# Patient Record
Sex: Male | Born: 1935 | Race: White | Hispanic: No | Marital: Married | Smoking: Current every day smoker
Health system: Southern US, Community
[De-identification: ages and names within clinical notes are randomized; demographics above are authoritative.]

## PROBLEM LIST (undated history)

## (undated) DIAGNOSIS — N210 Calculus in bladder: Secondary | ICD-10-CM

## (undated) DIAGNOSIS — Z862 Personal history of diseases of the blood and blood-forming organs and certain disorders involving the immune mechanism: Secondary | ICD-10-CM

## (undated) DIAGNOSIS — N201 Calculus of ureter: Secondary | ICD-10-CM

## (undated) DIAGNOSIS — K8020 Calculus of gallbladder without cholecystitis without obstruction: Secondary | ICD-10-CM

## (undated) DIAGNOSIS — Z86711 Personal history of pulmonary embolism: Secondary | ICD-10-CM

## (undated) DIAGNOSIS — M1990 Unspecified osteoarthritis, unspecified site: Secondary | ICD-10-CM

## (undated) DIAGNOSIS — Z87442 Personal history of urinary calculi: Secondary | ICD-10-CM

## (undated) DIAGNOSIS — N281 Cyst of kidney, acquired: Secondary | ICD-10-CM

## (undated) DIAGNOSIS — Z973 Presence of spectacles and contact lenses: Secondary | ICD-10-CM

## (undated) DIAGNOSIS — R972 Elevated prostate specific antigen [PSA]: Secondary | ICD-10-CM

## (undated) DIAGNOSIS — N400 Enlarged prostate without lower urinary tract symptoms: Secondary | ICD-10-CM

## (undated) DIAGNOSIS — Z974 Presence of external hearing-aid: Secondary | ICD-10-CM

## (undated) DIAGNOSIS — D649 Anemia, unspecified: Secondary | ICD-10-CM

## (undated) DIAGNOSIS — C61 Malignant neoplasm of prostate: Secondary | ICD-10-CM

## (undated) DIAGNOSIS — N200 Calculus of kidney: Secondary | ICD-10-CM

## (undated) DIAGNOSIS — Z85828 Personal history of other malignant neoplasm of skin: Secondary | ICD-10-CM

## (undated) DIAGNOSIS — R399 Unspecified symptoms and signs involving the genitourinary system: Secondary | ICD-10-CM

## (undated) DIAGNOSIS — Z8781 Personal history of (healed) traumatic fracture: Secondary | ICD-10-CM

## (undated) HISTORY — DX: Malignant neoplasm of prostate: C61

## (undated) HISTORY — PX: APPENDECTOMY: SHX54

## (undated) HISTORY — PX: CYSTOSCOPY/RETROGRADE/URETEROSCOPY/STONE EXTRACTION WITH BASKET: SHX5317

## (undated) HISTORY — PX: EXTRACORPOREAL SHOCK WAVE LITHOTRIPSY: SHX1557

## (undated) HISTORY — DX: Personal history of pulmonary embolism: Z86.711

---

## 1963-04-18 HISTORY — PX: PERCUTANEOUS NEPHROSTOLITHOTOMY: SHX2207

## 1999-03-15 ENCOUNTER — Inpatient Hospital Stay (HOSPITAL_COMMUNITY): Admission: EM | Admit: 1999-03-15 | Discharge: 1999-03-25 | Payer: Self-pay | Admitting: Family Medicine

## 1999-03-15 ENCOUNTER — Encounter: Payer: Self-pay | Admitting: Family Medicine

## 2000-10-12 ENCOUNTER — Encounter: Admission: RE | Admit: 2000-10-12 | Discharge: 2000-10-12 | Payer: Self-pay | Admitting: Urology

## 2000-10-12 ENCOUNTER — Encounter: Payer: Self-pay | Admitting: Urology

## 2000-10-24 ENCOUNTER — Encounter: Payer: Self-pay | Admitting: Urology

## 2000-10-25 ENCOUNTER — Ambulatory Visit (HOSPITAL_COMMUNITY): Admission: RE | Admit: 2000-10-25 | Discharge: 2000-10-25 | Payer: Self-pay | Admitting: Urology

## 2000-10-25 ENCOUNTER — Encounter: Payer: Self-pay | Admitting: Urology

## 2000-12-07 ENCOUNTER — Encounter: Admission: RE | Admit: 2000-12-07 | Discharge: 2000-12-07 | Payer: Self-pay | Admitting: Urology

## 2000-12-07 ENCOUNTER — Encounter: Payer: Self-pay | Admitting: Urology

## 2001-04-22 ENCOUNTER — Encounter: Admission: RE | Admit: 2001-04-22 | Discharge: 2001-04-22 | Payer: Self-pay | Admitting: Urology

## 2001-04-22 ENCOUNTER — Encounter: Payer: Self-pay | Admitting: Urology

## 2004-02-26 ENCOUNTER — Ambulatory Visit: Payer: Self-pay | Admitting: Oncology

## 2004-04-22 ENCOUNTER — Ambulatory Visit: Payer: Self-pay | Admitting: Oncology

## 2005-02-24 ENCOUNTER — Ambulatory Visit: Payer: Self-pay | Admitting: Oncology

## 2006-03-14 ENCOUNTER — Ambulatory Visit: Payer: Self-pay | Admitting: Oncology

## 2006-03-19 LAB — CBC WITH DIFFERENTIAL/PLATELET
BASO%: 0.8 % (ref 0.0–2.0)
Basophils Absolute: 0 10*3/uL (ref 0.0–0.1)
EOS%: 4 % (ref 0.0–7.0)
HCT: 42.4 % (ref 38.7–49.9)
HGB: 14.4 g/dL (ref 13.0–17.1)
LYMPH%: 22.4 % (ref 14.0–48.0)
MCH: 30.1 pg (ref 28.0–33.4)
MCHC: 34 g/dL (ref 32.0–35.9)
MONO#: 0.5 10*3/uL (ref 0.1–0.9)
NEUT%: 65.2 % (ref 40.0–75.0)
Platelets: 226 10*3/uL (ref 145–400)

## 2007-03-12 ENCOUNTER — Ambulatory Visit: Payer: Self-pay | Admitting: Oncology

## 2007-03-18 LAB — CBC WITH DIFFERENTIAL/PLATELET
BASO%: 1.2 % (ref 0.0–2.0)
Basophils Absolute: 0.1 10*3/uL (ref 0.0–0.1)
EOS%: 4.9 % (ref 0.0–7.0)
HCT: 40.8 % (ref 38.7–49.9)
HGB: 14.5 g/dL (ref 13.0–17.1)
MCH: 31.1 pg (ref 28.0–33.4)
MCHC: 35.6 g/dL (ref 32.0–35.9)
MCV: 87.4 fL (ref 81.6–98.0)
MONO%: 7 % (ref 0.0–13.0)
NEUT%: 63 % (ref 40.0–75.0)
lymph#: 1.1 10*3/uL (ref 0.9–3.3)

## 2008-03-26 ENCOUNTER — Ambulatory Visit: Payer: Self-pay | Admitting: Oncology

## 2008-03-30 LAB — CBC WITH DIFFERENTIAL/PLATELET
Basophils Absolute: 0 10*3/uL (ref 0.0–0.1)
Eosinophils Absolute: 0.2 10*3/uL (ref 0.0–0.5)
HCT: 40.6 % (ref 38.7–49.9)
HGB: 14.2 g/dL (ref 13.0–17.1)
LYMPH%: 26.5 % (ref 14.0–48.0)
MONO#: 0.4 10*3/uL (ref 0.1–0.9)
NEUT#: 3.3 10*3/uL (ref 1.5–6.5)
NEUT%: 61.6 % (ref 40.0–75.0)
Platelets: 198 10*3/uL (ref 145–400)
WBC: 5.3 10*3/uL (ref 4.0–10.0)
lymph#: 1.4 10*3/uL (ref 0.9–3.3)

## 2009-03-26 ENCOUNTER — Ambulatory Visit: Payer: Self-pay | Admitting: Oncology

## 2009-03-30 LAB — COMPREHENSIVE METABOLIC PANEL
Alkaline Phosphatase: 60 U/L (ref 39–117)
BUN: 18 mg/dL (ref 6–23)
Creatinine, Ser: 0.9 mg/dL (ref 0.40–1.50)
Glucose, Bld: 96 mg/dL (ref 70–99)
Sodium: 142 mEq/L (ref 135–145)
Total Bilirubin: 0.4 mg/dL (ref 0.3–1.2)
Total Protein: 6.3 g/dL (ref 6.0–8.3)

## 2009-03-30 LAB — CBC WITH DIFFERENTIAL/PLATELET
Eosinophils Absolute: 0.2 10*3/uL (ref 0.0–0.5)
LYMPH%: 29.3 % (ref 14.0–49.0)
MCV: 83.9 fL (ref 79.3–98.0)
MONO%: 9.4 % (ref 0.0–14.0)
NEUT#: 2.4 10*3/uL (ref 1.5–6.5)
NEUT%: 55.9 % (ref 39.0–75.0)
Platelets: 221 10*3/uL (ref 140–400)
RBC: 4.24 10*6/uL (ref 4.20–5.82)

## 2009-05-31 ENCOUNTER — Ambulatory Visit: Payer: Self-pay | Admitting: Oncology

## 2009-06-02 LAB — CBC & DIFF AND RETIC
Basophils Absolute: 0 10*3/uL (ref 0.0–0.1)
Eosinophils Absolute: 0.2 10*3/uL (ref 0.0–0.5)
HCT: 39.6 % (ref 38.4–49.9)
Immature Retic Fract: 6 % (ref 0.00–13.40)
LYMPH%: 25.9 % (ref 14.0–49.0)
MCV: 86.1 fL (ref 79.3–98.0)
MONO#: 0.4 10*3/uL (ref 0.1–0.9)
MONO%: 8.1 % (ref 0.0–14.0)
NEUT#: 3 10*3/uL (ref 1.5–6.5)
NEUT%: 61.6 % (ref 39.0–75.0)
Platelets: 163 10*3/uL (ref 140–400)
WBC: 4.9 10*3/uL (ref 4.0–10.3)

## 2009-06-02 LAB — MORPHOLOGY
PLT EST: ADEQUATE
RBC Comments: NORMAL

## 2009-06-02 LAB — IRON AND TIBC: %SAT: 60 % — ABNORMAL HIGH (ref 20–55)

## 2009-06-02 LAB — VITAMIN B12: Vitamin B-12: 224 pg/mL (ref 211–911)

## 2009-06-02 LAB — CHCC SMEAR

## 2009-09-26 ENCOUNTER — Ambulatory Visit (HOSPITAL_COMMUNITY): Admission: EM | Admit: 2009-09-26 | Discharge: 2009-09-26 | Payer: Self-pay | Admitting: Emergency Medicine

## 2009-09-27 HISTORY — PX: OTHER SURGICAL HISTORY: SHX169

## 2009-09-30 ENCOUNTER — Ambulatory Visit (HOSPITAL_COMMUNITY): Admission: RE | Admit: 2009-09-30 | Discharge: 2009-09-30 | Payer: Self-pay | Admitting: Urology

## 2009-09-30 HISTORY — PX: EXTRACORPOREAL SHOCK WAVE LITHOTRIPSY: SHX1557

## 2009-12-09 ENCOUNTER — Ambulatory Visit: Payer: Self-pay | Admitting: Oncology

## 2009-12-13 LAB — CBC & DIFF AND RETIC
BASO%: 0.4 % (ref 0.0–2.0)
Basophils Absolute: 0 10*3/uL (ref 0.0–0.1)
EOS%: 3.3 % (ref 0.0–7.0)
HGB: 14.5 g/dL (ref 13.0–17.1)
MCH: 31.5 pg (ref 27.2–33.4)
MCHC: 34.3 g/dL (ref 32.0–36.0)
MCV: 92 fL (ref 79.3–98.0)
MONO%: 7.4 % (ref 0.0–14.0)
NEUT%: 62.5 % (ref 39.0–75.0)
RDW: 13.2 % (ref 11.0–14.6)
lymph#: 1.5 10*3/uL (ref 0.9–3.3)

## 2010-05-10 ENCOUNTER — Ambulatory Visit
Admission: RE | Admit: 2010-05-10 | Discharge: 2010-05-10 | Payer: Self-pay | Source: Home / Self Care | Attending: Urology | Admitting: Urology

## 2010-05-10 HISTORY — PX: OTHER SURGICAL HISTORY: SHX169

## 2010-05-11 LAB — POCT HEMOGLOBIN-HEMACUE: Hemoglobin: 15.1 g/dL (ref 13.0–17.0)

## 2010-05-20 NOTE — Op Note (Signed)
[name], [name]                 ACCOUNT NO.:  [id number]  MEDICAL RECORD NO.:  [id number]          PATIENT TYPE:  AMB  LOCATION:  [facility]                         FACILITY:  [facility]  PHYSICIAN:  Danae Chen, M.D.  DATE OF BIRTH:  Aug 10, 1929  DATE OF PROCEDURE:  05/10/2010 DATE OF DISCHARGE:                              OPERATIVE REPORT   PREOPERATIVE DIAGNOSIS:  Left ureteral calculi and bladder stone.  POSTOPERATIVE DIAGNOSIS:  Left ureteral calculi and bladder stone.  PROCEDURE:  Cystoscopy, left retrograde pyelogram, ureteroscopy, holmium laser for left ureteral calculi, and removal of bladder stone.  SURGEON:  Danae Chen, MD  ANESTHESIA:  General.  INDICATIONS:  The patient is a 77 year old male with a long history of kidney stone.  He has been having left flank and left lower quadrant pain on and off.  CT scan showed bilateral renal calculi, minimal left hydronephrosis, and two stones in the distal ureter, one measures 7 mm, the other one 5 mm.  He was also found to have a 5-mm stone in the bladder.  He has not passed any stone.  He is scheduled today for cystoscopy, retrograde pyelogram, ureteroscopy, holmium laser of the ureteral calculi, and removal of bladder stone.  The procedure, the risks and benefits were discussed in detail with the patient and his wife.  The risks include but are not limited to hemorrhage, infection, ureteral injury, inability to extract stones.  He understands and gave informed consent.  The patient was identified by his wrist band and proper time-out was taken.  DESCRIPTION OF PROCEDURE:  Under general anesthesia, he was prepped and draped and placed in the dorsal lithotomy position.  A panendoscope was inserted in the bladder.  The anterior urethra is normal.  He has trilobar prostatic hypertrophy.  The bladder is moderately trabeculated. There is a stone in the bladder.  There is no tumor in the bladder.  The ureteral orifices are in  normal position and shape.  Retrograde pyelogram: A sensor wire was passed through the cystoscope through the left ureteral orifice and a #6-French open-ended catheter was passed over the sensor wire into the distal ureter.  The sensor wire was then removed.  Contrast was then injected through the open-ended catheter.  There are two filling defects in the distal ureter above the ureterovesical junction.  The ureter proximal to this filling defect is mildly dilated.  The renal pelvis and collecting system appear normal. The sensor wire was then passed through the open-ended catheter and the open-ended catheter was removed.  The bladder was emptied and the cystoscope removed.  A semi rigid ureteroscope was then passed in the bladder and through the left ureteral orifice.  There are two stones in the distal ureter, the most distal stone is larger than the proximal stone.  With the holmium laser, the stone was fragmented in multiple smaller fragments and all stone fragments were removed with the nitinol basket.  The ureteroscope was advanced all the way up into the upper ureter and there was no evidence of remaining stone fragments in the ureter.  Second retrograde pyelogram: Contrast was then  injected through the ureteroscope and there is no evidence of extravasation of contrast and there are no filling defects in the ureter.  The ureteroscope was then removed.  The cystoscope was reinserted in the bladder and all stone fragments were irrigated out of the bladder.  The bladder stone was also caught within the wire of the nitinol basket and extracted.  The guidewire was then back loaded into the cystoscope and a #6-French - 26 double-J stent was passed over the guidewire.  The proximal curl of the double-J stent is in the renal pelvis.  The distal curl is in the bladder.  The bladder was then emptied and the cystoscope and guidewire were removed.  The patient tolerated the procedure well  and left the OR in satisfactory condition to post anesthesia care unit.  The double-J stent was left with a string for easy removal.     Danae Chen, M.D.     MN/MEDQ  D:  05/10/2010  T:  05/10/2010  Job:  [id number]  Electronically Signed by Lindaann Slough M.D. on 05/20/2010 01:13:08 PM

## 2010-06-06 ENCOUNTER — Other Ambulatory Visit: Payer: Self-pay | Admitting: Oncology

## 2010-06-06 ENCOUNTER — Encounter (HOSPITAL_BASED_OUTPATIENT_CLINIC_OR_DEPARTMENT_OTHER): Payer: BC Managed Care – PPO | Admitting: Oncology

## 2010-06-06 DIAGNOSIS — C61 Malignant neoplasm of prostate: Secondary | ICD-10-CM

## 2010-06-06 DIAGNOSIS — Z7901 Long term (current) use of anticoagulants: Secondary | ICD-10-CM

## 2010-06-06 DIAGNOSIS — Z86718 Personal history of other venous thrombosis and embolism: Secondary | ICD-10-CM

## 2010-06-06 DIAGNOSIS — D509 Iron deficiency anemia, unspecified: Secondary | ICD-10-CM

## 2010-06-06 LAB — BASIC METABOLIC PANEL
BUN: 17 mg/dL (ref 6–23)
CO2: 29 mEq/L (ref 19–32)
Calcium: 8.9 mg/dL (ref 8.4–10.5)
Glucose, Bld: 84 mg/dL (ref 70–99)
Sodium: 143 mEq/L (ref 135–145)

## 2010-06-06 LAB — CBC WITH DIFFERENTIAL/PLATELET
Basophils Absolute: 0 10*3/uL (ref 0.0–0.1)
Eosinophils Absolute: 0.2 10*3/uL (ref 0.0–0.5)
HCT: 41.3 % (ref 38.4–49.9)
HGB: 14.2 g/dL (ref 13.0–17.1)
LYMPH%: 24.3 % (ref 14.0–49.0)
MCV: 92.8 fL (ref 79.3–98.0)
MONO#: 0.4 10*3/uL (ref 0.1–0.9)
MONO%: 7 % (ref 0.0–14.0)
NEUT#: 3.2 10*3/uL (ref 1.5–6.5)
NEUT%: 64.7 % (ref 39.0–75.0)
Platelets: 173 10*3/uL (ref 140–400)
WBC: 5 10*3/uL (ref 4.0–10.3)

## 2010-06-06 LAB — PSA: PSA: 6.47 ng/mL — ABNORMAL HIGH (ref <=4.00)

## 2010-06-13 ENCOUNTER — Encounter (HOSPITAL_BASED_OUTPATIENT_CLINIC_OR_DEPARTMENT_OTHER): Payer: BC Managed Care – PPO | Admitting: Oncology

## 2010-06-13 DIAGNOSIS — C61 Malignant neoplasm of prostate: Secondary | ICD-10-CM

## 2010-06-13 DIAGNOSIS — Z86718 Personal history of other venous thrombosis and embolism: Secondary | ICD-10-CM

## 2010-06-13 DIAGNOSIS — Z7901 Long term (current) use of anticoagulants: Secondary | ICD-10-CM

## 2010-06-13 DIAGNOSIS — D509 Iron deficiency anemia, unspecified: Secondary | ICD-10-CM

## 2010-07-04 LAB — PSA: PSA: 5.02 ng/mL — ABNORMAL HIGH (ref 0.10–4.00)

## 2010-07-04 LAB — DIFFERENTIAL
Basophils Absolute: 0 K/uL (ref 0.0–0.1)
Basophils Relative: 1 % (ref 0–1)
Eosinophils Absolute: 0.2 10*3/uL (ref 0.0–0.7)
Eosinophils Relative: 3 % (ref 0–5)
Lymphocytes Relative: 16 % (ref 12–46)
Lymphs Abs: 1.1 K/uL (ref 0.7–4.0)
Monocytes Absolute: 0.4 10*3/uL (ref 0.1–1.0)
Monocytes Relative: 6 % (ref 3–12)
Neutro Abs: 5.5 10*3/uL (ref 1.7–7.7)
Neutrophils Relative %: 75 % (ref 43–77)

## 2010-07-04 LAB — URINALYSIS, ROUTINE W REFLEX MICROSCOPIC
Bilirubin Urine: NEGATIVE
Glucose, UA: NEGATIVE mg/dL
Ketones, ur: NEGATIVE mg/dL
Nitrite: NEGATIVE
Protein, ur: 30 mg/dL — AB
Specific Gravity, Urine: 1.023 (ref 1.005–1.030)
Urobilinogen, UA: 1 mg/dL (ref 0.0–1.0)
pH: 6 (ref 5.0–8.0)

## 2010-07-04 LAB — CBC
HCT: 42.2 % (ref 39.0–52.0)
Hemoglobin: 14.1 g/dL (ref 13.0–17.0)
MCHC: 33.4 g/dL (ref 30.0–36.0)
MCV: 94 fL (ref 78.0–100.0)
Platelets: 174 K/uL (ref 150–400)
RBC: 4.49 MIL/uL (ref 4.22–5.81)
RDW: 13.3 % (ref 11.5–15.5)
WBC: 7.3 10*3/uL (ref 4.0–10.5)

## 2010-07-04 LAB — URINE MICROSCOPIC-ADD ON

## 2010-07-04 LAB — POCT I-STAT, CHEM 8
BUN: 16 mg/dL (ref 6–23)
Calcium, Ion: 1.13 mmol/L (ref 1.12–1.32)
Chloride: 108 meq/L (ref 96–112)
Creatinine, Ser: 1.2 mg/dL (ref 0.4–1.5)
Glucose, Bld: 107 mg/dL — ABNORMAL HIGH (ref 70–99)
HCT: 43 % (ref 39.0–52.0)
Hemoglobin: 14.6 g/dL (ref 13.0–17.0)
Potassium: 3.6 mEq/L (ref 3.5–5.1)
Sodium: 141 mEq/L (ref 135–145)
TCO2: 23 mmol/L (ref 0–100)

## 2011-07-04 ENCOUNTER — Other Ambulatory Visit (HOSPITAL_BASED_OUTPATIENT_CLINIC_OR_DEPARTMENT_OTHER): Payer: Medicare Other | Admitting: Lab

## 2011-07-04 DIAGNOSIS — Z86718 Personal history of other venous thrombosis and embolism: Secondary | ICD-10-CM

## 2011-07-04 DIAGNOSIS — C61 Malignant neoplasm of prostate: Secondary | ICD-10-CM

## 2011-07-04 DIAGNOSIS — Z7901 Long term (current) use of anticoagulants: Secondary | ICD-10-CM

## 2011-07-04 DIAGNOSIS — D509 Iron deficiency anemia, unspecified: Secondary | ICD-10-CM

## 2011-07-04 LAB — COMPREHENSIVE METABOLIC PANEL
ALT: 20 U/L (ref 0–53)
AST: 12 U/L (ref 0–37)
Albumin: 4.1 g/dL (ref 3.5–5.2)
Alkaline Phosphatase: 63 U/L (ref 39–117)
BUN: 17 mg/dL (ref 6–23)
Calcium: 8.9 mg/dL (ref 8.4–10.5)
Chloride: 108 mEq/L (ref 96–112)
Potassium: 4.2 mEq/L (ref 3.5–5.3)
Sodium: 140 mEq/L (ref 135–145)
Total Protein: 6.2 g/dL (ref 6.0–8.3)

## 2011-07-04 LAB — CBC WITH DIFFERENTIAL/PLATELET
BASO%: 0.4 % (ref 0.0–2.0)
Basophils Absolute: 0 10*3/uL (ref 0.0–0.1)
EOS%: 2.2 % (ref 0.0–7.0)
HGB: 14.7 g/dL (ref 13.0–17.1)
MCH: 30.6 pg (ref 27.2–33.4)
MCHC: 34.3 g/dL (ref 32.0–36.0)
MCV: 89.2 fL (ref 79.3–98.0)
MONO%: 7.4 % (ref 0.0–14.0)
RBC: 4.8 10*6/uL (ref 4.20–5.82)
RDW: 12.6 % (ref 11.0–14.6)
lymph#: 1.7 10*3/uL (ref 0.9–3.3)

## 2011-07-11 ENCOUNTER — Telehealth: Payer: Self-pay | Admitting: Oncology

## 2011-07-11 ENCOUNTER — Ambulatory Visit (HOSPITAL_BASED_OUTPATIENT_CLINIC_OR_DEPARTMENT_OTHER): Payer: Medicare Other | Admitting: Oncology

## 2011-07-11 ENCOUNTER — Encounter: Payer: Self-pay | Admitting: Oncology

## 2011-07-11 VITALS — BP 139/77 | HR 56 | Temp 97.6°F | Ht 69.0 in | Wt 190.5 lb

## 2011-07-11 DIAGNOSIS — D509 Iron deficiency anemia, unspecified: Secondary | ICD-10-CM

## 2011-07-11 DIAGNOSIS — Z86711 Personal history of pulmonary embolism: Secondary | ICD-10-CM

## 2011-07-11 DIAGNOSIS — C61 Malignant neoplasm of prostate: Secondary | ICD-10-CM

## 2011-07-11 DIAGNOSIS — N200 Calculus of kidney: Secondary | ICD-10-CM | POA: Insufficient documentation

## 2011-07-11 NOTE — Progress Notes (Signed)
Hematology and Oncology Follow Up Visit  YAMATO KOPF [id number] 13-Aug-1929 78 y.o. 07/11/2011 2:14 PM   Principle Diagnosis: Encounter Diagnoses  Name Primary?  Marland Kitchen Hx of pulmonary embolus   . History of pulmonary embolus (PE) Yes  . Prostate carcinoma   . Recurrent nephrolithiasis   . 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  I kept him on full dose anticoagulation for five years.  I elected to stop blood thinners at that time and just maintain him on a single 81 mg dose of aspirin daily.  He has done well with no new thrombotic events.    In March of 2011 he was evaluated for an elevated PSA.  Biopsies did show early prostate cancer.  He has elected for close observation alone. PSAs have remained overall stable in the 5-7 range. Current value 7.07 on 07/04/2011 compared with 6.47 on 06/06/2010.  In 2010 he had a fall in his hemoglobin with microcytic indices. Stool is guaiac-negative. He refused to have a colonoscopy. I put him on iron replacement and he had a complete response with rise in hemoglobin back to normal values and hemoglobin today is 14.7.     Medications: reviewed  Allergies:   Review of Systems: Constitutional:   No constitutional symptoms Respiratory: No cough or dyspnea Cardiovascular:  No chest pain or palpitations Gastrointestinal: No abdominal pain or change in bowel habit Genito-Urinary: No urinary frequency or urgency Musculoskeletal: No muscle or bone pain Neurologic: No headache or change in vision Skin: No rash Remaining ROS negative.  Physical Exam: Blood pressure 139/77, pulse 56, temperature 97.6 F (36.4 C), temperature source Oral, height 5\' 9"  (1.753 m), weight 190 lb 8 oz (86.41 kg). Wt Readings from Last 3 Encounters:  07/11/11  190 lb 8 oz (86.41 kg)     General appearance: Well-nourished Caucasian man HENNT: Pharynx no erythema or exudate Lymph nodes: No adenopathy Breasts: Lungs: Clear to auscultation resonant to percussion Heart: Regular rhythm no murmur Abdomen: Soft nontender no mass no organomegaly Extremities: No edema no calf tenderness Vascular: No cyanosis Neurologic: Motor strength 5 over 5 reflexes 2+ symmetric Skin: No rash or ecchymosis  Lab Results: Lab Results  Component Value Date   WBC 6.7 07/04/2011   HGB 14.7 07/04/2011   HCT 42.8 07/04/2011   MCV 89.2 07/04/2011   PLT 210 07/04/2011     Chemistry      Component Value Date/Time   NA 140 07/04/2011 1431   K 4.2 07/04/2011 1431   CL 108 07/04/2011 1431   CO2 22 07/04/2011 1431   BUN 17 07/04/2011 1431   CREATININE 0.80 07/04/2011 1431      Component Value Date/Time   CALCIUM 8.9 07/04/2011 1431   ALKPHOS 63 07/04/2011 1431   AST 12 07/04/2011 1431   ALT 20 07/04/2011 1431   BILITOT 0.6 07/04/2011 1431    PSA 7.07   Impression and Plan:  1. Unprovoked pulmonary embolus treated as outlined above, currently on aspirin alone and out   over 12 years from his event.  Plan, continue aspirin. 2. Early stage prostate cancer, Gleason 3+3 involving 10% of 1 core biopsy of the left lobe done   June 29, 2009.  PSA overall stable.   3. Chronic nephrolithiasis.  Previous lithotripsy in  2011. No recurrent stones over the last year. 4.         Iron deficiency anemia. Complete resolution with oral iron replacement.   CC:. Dr. Elias Else; Dr. Ezzie Dural   Levert Feinstein, MD 3/26/20132:14 PM

## 2011-07-11 NOTE — Telephone Encounter (Signed)
gv pt appt schedule for march 2014. Per 07/11/2011 pof no lb today (3/26) lb in one yr.

## 2011-07-12 ENCOUNTER — Other Ambulatory Visit: Payer: Self-pay | Admitting: *Deleted

## 2011-07-12 DIAGNOSIS — D509 Iron deficiency anemia, unspecified: Secondary | ICD-10-CM

## 2011-07-12 MED ORDER — POLYSACCHARIDE IRON COMPLEX 150 MG PO CAPS: 150.0000 mg | ORAL_CAPSULE | Freq: Every day | ORAL | Status: DC

## 2012-01-02 ENCOUNTER — Other Ambulatory Visit: Payer: Self-pay | Admitting: Urology

## 2012-01-03 ENCOUNTER — Encounter (HOSPITAL_BASED_OUTPATIENT_CLINIC_OR_DEPARTMENT_OTHER): Payer: Self-pay | Admitting: *Deleted

## 2012-01-04 ENCOUNTER — Encounter (HOSPITAL_BASED_OUTPATIENT_CLINIC_OR_DEPARTMENT_OTHER): Payer: Self-pay | Admitting: *Deleted

## 2012-01-04 NOTE — Progress Notes (Signed)
NPO AFTER MN. ARRIVES AT 0730. NEEDS HG AND EKG. 

## 2012-01-09 ENCOUNTER — Encounter (HOSPITAL_BASED_OUTPATIENT_CLINIC_OR_DEPARTMENT_OTHER): Payer: Self-pay | Admitting: Anesthesiology

## 2012-01-09 ENCOUNTER — Encounter (HOSPITAL_BASED_OUTPATIENT_CLINIC_OR_DEPARTMENT_OTHER): Admission: RE | Payer: Self-pay | Source: Ambulatory Visit

## 2012-01-09 ENCOUNTER — Ambulatory Visit (HOSPITAL_BASED_OUTPATIENT_CLINIC_OR_DEPARTMENT_OTHER): Admission: RE | Admit: 2012-01-09 | Payer: Medicare Other | Source: Ambulatory Visit | Admitting: Urology

## 2012-01-09 HISTORY — DX: Unspecified osteoarthritis, unspecified site: M19.90

## 2012-01-09 HISTORY — DX: Calculus of ureter: N20.1

## 2012-01-09 HISTORY — DX: Personal history of urinary calculi: Z87.442

## 2012-01-09 SURGERY — CYSTOSCOPY/RETROGRADE/URETEROSCOPY
Anesthesia: General | Laterality: Right

## 2012-01-09 NOTE — Anesthesia Preprocedure Evaluation (Deleted)
Anesthesia Evaluation  Patient identified by MRN, date of birth, ID band Patient awake    Reviewed: Allergy & Precautions, H&P , NPO status , Patient's Chart, lab work & pertinent test results  Airway Mallampati: II TM Distance: >3 FB Neck ROM: Limited    Dental No notable dental hx.    Pulmonary PE breath sounds clear to auscultation  Pulmonary exam normal       Cardiovascular negative cardio ROS  Rhythm:Regular Rate:Normal     Neuro/Psych negative neurological ROS  negative psych ROS   GI/Hepatic negative GI ROS, Neg liver ROS,   Endo/Other  negative endocrine ROS  Renal/GU negative Renal ROS  negative genitourinary   Musculoskeletal negative musculoskeletal ROS (+)   Abdominal   Peds negative pediatric ROS (+)  Hematology negative hematology ROS (+)   Anesthesia Other Findings   Reproductive/Obstetrics negative OB ROS                          Anesthesia Physical Anesthesia Plan  ASA: II  Anesthesia Plan: General   Post-op Pain Management:    Induction: Intravenous  Airway Management Planned: LMA  Additional Equipment:   Intra-op Plan:   Post-operative Plan:   Informed Consent: I have reviewed the patients History and Physical, chart, labs and discussed the procedure including the risks, benefits and alternatives for the proposed anesthesia with the patient or authorized representative who has indicated his/her understanding and acceptance.   Dental advisory given  Plan Discussed with: CRNA and Surgeon  Anesthesia Plan Comments:         Anesthesia Quick Evaluation

## 2012-06-12 ENCOUNTER — Telehealth: Payer: Self-pay | Admitting: Oncology

## 2012-06-12 NOTE — Telephone Encounter (Signed)
confirmed his visit...td

## 2012-06-12 NOTE — Telephone Encounter (Signed)
left pt message gave appt d/t..informed pt that i will mail out a letter/cal...td

## 2012-06-17 ENCOUNTER — Telehealth: Payer: Self-pay | Admitting: Oncology

## 2012-06-17 NOTE — Telephone Encounter (Signed)
spoke with his wife..gv appt d/t.Marland Kitchentd

## 2012-06-20 ENCOUNTER — Telehealth: Payer: Self-pay | Admitting: Oncology

## 2012-06-20 NOTE — Telephone Encounter (Signed)
sw pt gave appt d/t..td °

## 2012-06-24 ENCOUNTER — Telehealth: Payer: Self-pay | Admitting: *Deleted

## 2012-06-24 NOTE — Telephone Encounter (Signed)
lm gave appts d/t..pt is aware of 07/26/2012@ 10:45...td

## 2012-07-02 ENCOUNTER — Other Ambulatory Visit (HOSPITAL_BASED_OUTPATIENT_CLINIC_OR_DEPARTMENT_OTHER): Payer: Medicare Other | Admitting: Lab

## 2012-07-02 DIAGNOSIS — D509 Iron deficiency anemia, unspecified: Secondary | ICD-10-CM

## 2012-07-02 DIAGNOSIS — N200 Calculus of kidney: Secondary | ICD-10-CM

## 2012-07-02 DIAGNOSIS — C61 Malignant neoplasm of prostate: Secondary | ICD-10-CM

## 2012-07-02 DIAGNOSIS — Z86711 Personal history of pulmonary embolism: Secondary | ICD-10-CM

## 2012-07-02 LAB — CBC WITH DIFFERENTIAL/PLATELET
BASO%: 0.6 % (ref 0.0–2.0)
Eosinophils Absolute: 0.2 10*3/uL (ref 0.0–0.5)
HCT: 41.7 % (ref 38.4–49.9)
LYMPH%: 22 % (ref 14.0–49.0)
MCHC: 35.2 g/dL (ref 32.0–36.0)
MCV: 90.1 fL (ref 79.3–98.0)
MONO#: 0.4 10*3/uL (ref 0.1–0.9)
MONO%: 6.4 % (ref 0.0–14.0)
NEUT%: 67.9 % (ref 39.0–75.0)
Platelets: 178 10*3/uL (ref 140–400)
RBC: 4.62 10*6/uL (ref 4.20–5.82)
WBC: 5.7 10*3/uL (ref 4.0–10.3)

## 2012-07-02 LAB — COMPREHENSIVE METABOLIC PANEL (CC13)
BUN: 17.5 mg/dL (ref 7.0–26.0)
CO2: 24 mEq/L (ref 22–29)
Calcium: 9.2 mg/dL (ref 8.4–10.4)
Chloride: 109 mEq/L — ABNORMAL HIGH (ref 98–107)
Creatinine: 0.9 mg/dL (ref 0.7–1.3)
Total Bilirubin: 0.83 mg/dL (ref 0.20–1.20)

## 2012-07-09 ENCOUNTER — Ambulatory Visit: Payer: Medicare Other | Admitting: Oncology

## 2012-07-23 ENCOUNTER — Other Ambulatory Visit: Payer: Self-pay | Admitting: *Deleted

## 2012-07-23 DIAGNOSIS — D509 Iron deficiency anemia, unspecified: Secondary | ICD-10-CM

## 2012-07-23 MED ORDER — POLYSACCHARIDE IRON COMPLEX 150 MG PO CAPS: 150.0000 mg | ORAL_CAPSULE | Freq: Every day | ORAL | Status: DC

## 2012-07-26 ENCOUNTER — Ambulatory Visit (HOSPITAL_BASED_OUTPATIENT_CLINIC_OR_DEPARTMENT_OTHER): Payer: Medicare Other | Admitting: Nurse Practitioner

## 2012-07-26 ENCOUNTER — Telehealth: Payer: Self-pay | Admitting: Oncology

## 2012-07-26 VITALS — BP 135/82 | HR 61 | Temp 97.6°F | Resp 20 | Ht 70.0 in | Wt 139.9 lb

## 2012-07-26 DIAGNOSIS — D509 Iron deficiency anemia, unspecified: Secondary | ICD-10-CM

## 2012-07-26 DIAGNOSIS — Z86711 Personal history of pulmonary embolism: Secondary | ICD-10-CM

## 2012-07-26 DIAGNOSIS — C61 Malignant neoplasm of prostate: Secondary | ICD-10-CM

## 2012-07-26 NOTE — Progress Notes (Signed)
OFFICE PROGRESS NOTE  Interval history:  Mr. Angerer is an 79 year old man with history of an acute upper pulmonary embolus in November 2000. He completed 5 years of full dose anticoagulation. He has since been maintained on aspirin 81 mg daily. He has had no new thrombotic events.  In March 2011 he was evaluated for an elevated PSA. Biopsies showed early prostate cancer. He elected for close observation. PSAs have remained stable with the most recent value being 6.50 on 07/02/2012.  In 2010 he developed a microcytic anemia. Stool was guaiac-negative. He declined a colonoscopy. He was started on oral iron with subsequent normalization of the hemoglobin.  He feels well. No complaints. He denies pain. No interim illnesses or infections. No recent problems with kidney stones. No shortness of breath or chest pain. No leg swelling or calf pain. He denies bleeding.   Objective: Blood pressure 135/82, pulse 61, temperature 97.6 F (36.4 C), temperature source Oral, resp. rate 20, height 5\' 10"  (1.778 m), weight 139 lb 14.4 oz (63.458 kg).  Oropharynx is without thrush or ulceration. No palpable cervical, supraclavicular, axillary or inguinal lymph nodes. Lungs are clear. Regular cardiac rhythm. Abdomen is soft and nontender. No organomegaly. Extremities are without edema.  Lab Results: Lab Results  Component Value Date   WBC 5.7 07/02/2012   HGB 14.7 07/02/2012   HCT 41.7 07/02/2012   MCV 90.1 07/02/2012   PLT 178 07/02/2012    Chemistry:    Chemistry      Component Value Date/Time   NA 140 07/02/2012 1329   NA 140 07/04/2011 1431   K 4.0 07/02/2012 1329   K 4.2 07/04/2011 1431   CL 109* 07/02/2012 1329   CL 108 07/04/2011 1431   CO2 24 07/02/2012 1329   CO2 22 07/04/2011 1431   BUN 17.5 07/02/2012 1329   BUN 17 07/04/2011 1431   CREATININE 0.9 07/02/2012 1329   CREATININE 0.80 07/04/2011 1431      Component Value Date/Time   CALCIUM 9.2 07/02/2012 1329   CALCIUM 8.9 07/04/2011 1431   ALKPHOS 68  07/02/2012 1329   ALKPHOS 63 07/04/2011 1431   AST 13 07/02/2012 1329   AST 12 07/04/2011 1431   ALT 15 07/02/2012 1329   ALT 20 07/04/2011 1431   BILITOT 0.83 07/02/2012 1329   BILITOT 0.6 07/04/2011 1431       Studies/Results: No results found.  Medications: I have reviewed the patient's current medications.  Assessment/Plan:  1. Unprovoked pulmonary embolus November 2000 status post 5 years of full dose anticoagulation subsequently started on aspirin 81 mg daily which he continues. No subsequent thrombotic events. 2. Early-stage prostate cancer, Gleason 3+3 involving 10% of one core biopsy of the left lobe done 06/29/2009. PSA remains stable. 3. Iron deficiency anemia. Resolved with oral iron replacement.  Disposition-Mr. Rossetti will return for a followup visit in one year. He will contact the office in the interim with any problems.  Lonna Cobb ANP/GNP-BC   CC Dr. Elias Else and Dr. Ezzie Dural.

## 2012-07-26 NOTE — Telephone Encounter (Signed)
gv and printed appt schedule for pt for April 2015

## 2012-12-06 ENCOUNTER — Telehealth: Payer: Self-pay | Admitting: *Deleted

## 2012-12-06 NOTE — Telephone Encounter (Signed)
Patient calls concerned about his blood pressure.  He saw Dr. Brunilda Payor 7/30 and his blood pressure was fine, however recently when he has checked it its been 185/95.  Discussed with Dr. Cyndie Chime and then called patient back.  He needs to be seen for his blood pressure, but Dr. Cyndie Chime can not act as his PCP.  He needs to obtain a PCP.  He can call his wife's PCP and see if he can get in there or he can call [facility] and get in there.  He needs to be seen for his blood pressure.  If they can not see him today then he can go to an urgent care center and let them treat his blood pressure today until he can get an appt. With a PCP.  He verbalized understanding and is fine with this plan.

## 2013-06-16 ENCOUNTER — Encounter: Payer: Self-pay | Admitting: Oncology

## 2013-07-25 ENCOUNTER — Other Ambulatory Visit: Payer: Medicare Other

## 2013-07-25 ENCOUNTER — Ambulatory Visit: Payer: Medicare Other | Admitting: Oncology

## 2016-09-16 ENCOUNTER — Encounter (HOSPITAL_COMMUNITY): Payer: Self-pay | Admitting: Emergency Medicine

## 2016-09-16 ENCOUNTER — Emergency Department (HOSPITAL_COMMUNITY): Payer: Medicare Other

## 2016-09-16 ENCOUNTER — Emergency Department (HOSPITAL_COMMUNITY)
Admission: EM | Admit: 2016-09-16 | Discharge: 2016-09-16 | Disposition: A | Discharge status: Home / Self Care | Payer: Medicare Other | Attending: Emergency Medicine | Admitting: Emergency Medicine

## 2016-09-16 DIAGNOSIS — N200 Calculus of kidney: Secondary | ICD-10-CM | POA: Insufficient documentation

## 2016-09-16 DIAGNOSIS — R319 Hematuria, unspecified: Secondary | ICD-10-CM | POA: Diagnosis not present

## 2016-09-16 DIAGNOSIS — F17290 Nicotine dependence, other tobacco product, uncomplicated: Secondary | ICD-10-CM | POA: Diagnosis not present

## 2016-09-16 DIAGNOSIS — Z79899 Other long term (current) drug therapy: Secondary | ICD-10-CM | POA: Insufficient documentation

## 2016-09-16 DIAGNOSIS — Z7982 Long term (current) use of aspirin: Secondary | ICD-10-CM | POA: Insufficient documentation

## 2016-09-16 DIAGNOSIS — R3129 Other microscopic hematuria: Secondary | ICD-10-CM

## 2016-09-16 DIAGNOSIS — R1032 Left lower quadrant pain: Secondary | ICD-10-CM | POA: Diagnosis present

## 2016-09-16 LAB — CBC
HEMATOCRIT: 40.7 % (ref 39.0–52.0)
HEMOGLOBIN: 13.6 g/dL (ref 13.0–17.0)
MCH: 30.8 pg (ref 26.0–34.0)
MCHC: 33.4 g/dL (ref 30.0–36.0)
MCV: 92.1 fL (ref 78.0–100.0)
Platelets: 160 10*3/uL (ref 150–400)
RBC: 4.42 MIL/uL (ref 4.22–5.81)
RDW: 13.1 % (ref 11.5–15.5)
WBC: 10.7 10*3/uL — AB (ref 4.0–10.5)

## 2016-09-16 LAB — BASIC METABOLIC PANEL
Anion gap: 8 (ref 5–15)
BUN: 22 mg/dL — AB (ref 6–20)
CHLORIDE: 109 mmol/L (ref 101–111)
CO2: 24 mmol/L (ref 22–32)
Calcium: 9.1 mg/dL (ref 8.9–10.3)
Creatinine, Ser: 1.22 mg/dL (ref 0.61–1.24)
GFR, EST NON AFRICAN AMERICAN: 53 mL/min — AB (ref >60)
Glucose, Bld: 125 mg/dL — ABNORMAL HIGH (ref 65–99)
POTASSIUM: 4.2 mmol/L (ref 3.5–5.1)
SODIUM: 141 mmol/L (ref 135–145)

## 2016-09-16 LAB — URINALYSIS, ROUTINE W REFLEX MICROSCOPIC
BACTERIA UA: NONE SEEN
Bilirubin Urine: NEGATIVE
Glucose, UA: NEGATIVE mg/dL
Ketones, ur: 5 mg/dL — AB
Leukocytes, UA: NEGATIVE
NITRITE: NEGATIVE
PROTEIN: NEGATIVE mg/dL
SPECIFIC GRAVITY, URINE: 1.013 (ref 1.005–1.030)
Squamous Epithelial / LPF: NONE SEEN
pH: 7 (ref 5.0–8.0)

## 2016-09-16 MED ORDER — OXYCODONE-ACETAMINOPHEN 5-325 MG PO TABS
2.0000 | ORAL_TABLET | Freq: Once | ORAL | Status: AC
Start: 2016-09-16 — End: 2016-09-16
  Administered 2016-09-16: 2 via ORAL
  Filled 2016-09-16: qty 2

## 2016-09-16 MED ORDER — KETOROLAC TROMETHAMINE 30 MG/ML IJ SOLN
30.0000 mg | Freq: Once | INTRAMUSCULAR | Status: AC
  Administered 2016-09-16: 30 mg via INTRAVENOUS
  Filled 2016-09-16: qty 1

## 2016-09-16 MED ORDER — SODIUM CHLORIDE 0.9 % IV BOLUS (SEPSIS)
1000.0000 mL | Freq: Once | INTRAVENOUS | Status: AC
  Administered 2016-09-16: 1000 mL via INTRAVENOUS

## 2016-09-16 MED ORDER — TAMSULOSIN HCL 0.4 MG PO CAPS: 0.4000 mg | ORAL_CAPSULE | Freq: Every day | ORAL | 0 refills | Status: DC

## 2016-09-16 MED ORDER — OXYCODONE-ACETAMINOPHEN 5-325 MG PO TABS: 1.0000 | ORAL_TABLET | ORAL | 0 refills | Status: DC | PRN

## 2016-09-16 MED ORDER — ONDANSETRON 4 MG PO TBDP: 4.0000 mg | ORAL_TABLET | Freq: Three times a day (TID) | ORAL | 0 refills | Status: DC | PRN

## 2016-09-16 MED ORDER — FENTANYL CITRATE (PF) 100 MCG/2ML IJ SOLN
50.0000 ug | Freq: Once | INTRAMUSCULAR | Status: AC
  Administered 2016-09-16: 50 ug via INTRAVENOUS
  Filled 2016-09-16: qty 2

## 2016-09-16 MED ORDER — MORPHINE SULFATE (PF) 2 MG/ML IV SOLN
4.0000 mg | Freq: Once | INTRAVENOUS | Status: AC
  Administered 2016-09-16: 4 mg via INTRAVENOUS
  Filled 2016-09-16: qty 2

## 2016-09-16 NOTE — ED Notes (Signed)
US at bedside

## 2016-09-16 NOTE — ED Triage Notes (Signed)
Pt states that he began having flank pain at 0100. Hx of kidney stones since 1965, says it feels the same. Took an oxycodone at home at about 0530 and it did not help.

## 2016-09-16 NOTE — ED Provider Notes (Signed)
[facility] DEPT Provider Note   CSN: [id number] Arrival date & time: 09/16/16  7829     History   Chief Complaint Chief Complaint  Patient presents with  . Flank Pain    Left    HPI [name] is a 83 y.o. male.  83yo M w/ PMH including prostate cancer, PE, kidney stones, anemia who p/w L flank pain .He reports sudden onset of L flank pain that has been constant and progressively worse since it began. Pain radiates into L lower abdomen and groin and is associated with nausea. He states this pain feels like previous episodes of kidney stones. He notes he has been passing them over the past 1 month.   He took an oxycodone at 5:30am with no improvement. No fevers, vomiting, recent illness, or urinary symptoms.    Flank Pain     Past Medical History:  Diagnosis Date  . Arthritis   . Frequency of urination   . History of kidney stones   . History of pulmonary embolus (PE)    November 2000 unprovoked  . Iron deficiency anemia   . Nocturia   . Prostate carcinoma ([place]) DX  MARCH 2011-  FOLLOWED BY DR [facility]   Gleason 3+3 06/29/09 Rx observation  . Recurrent nephrolithiasis 07/11/2011  . Right ureteral stone     Patient Active Problem List   Diagnosis Date Noted  . History of pulmonary embolus (PE) 07/11/2011  . Prostate carcinoma ([place]) 07/11/2011  . Recurrent nephrolithiasis 07/11/2011  . Iron deficiency anemia 07/11/2011    Past Surgical History:  Procedure Laterality Date  . CYSTO/ LEFT RETROGRADE PYELOGRAM/ LEFT STENT PLACEMENT  09-27-2009  . EXTRACORPOREAL SHOCK WAVE LITHOTRIPSY  JUNE 2011  . LEFT URETEROSCOPY STONE EXTRACTION AND REMOVAL BLADDER STONE  05-10-2010       Home Medications    Prior to Admission medications   Medication Sig Start Date End Date Taking? Authorizing Provider  aspirin 81 MG tablet Take 81 mg by mouth daily.    [provider]  iron polysaccharides (POLY-IRON 150) 150 MG capsule Take 1 capsule (150 mg total) by mouth  daily. 07/23/12   Annia Belt, MD  Lutein 6 MG CAPS Take 1 capsule by mouth daily.    [provider]  ondansetron (ZOFRAN ODT) 4 MG disintegrating tablet Take 1 tablet (4 mg total) by mouth every 8 (eight) hours as needed for nausea or vomiting. 09/16/16   Little, Wenda Overland, MD  oxyCODONE-acetaminophen (PERCOCET) 5-325 MG tablet Take 1-2 tablets by mouth every 4 (four) hours as needed. 09/16/16   Little, Wenda Overland, MD  tamsulosin (FLOMAX) 0.4 MG CAPS capsule Take 1 capsule (0.4 mg total) by mouth daily. 09/16/16   Little, Wenda Overland, MD    Family History History reviewed. No pertinent family history.  Social History Social History  Substance Use Topics  . Smoking status: Current Every Day Smoker    Years: 60.00    Types: Pipe  . Smokeless tobacco: Never Used  . Alcohol use No     Allergies   Patient has no known allergies.   Review of Systems Review of Systems  Genitourinary: Positive for flank pain.   All other systems reviewed and are negative except that which was mentioned in HPI   Physical Exam Updated Vital Signs BP 131/84 (BP Location: Left Arm)   Pulse 65   Temp 98.2 F (36.8 C) (Oral)   Resp 18   SpO2 94%   Physical Exam  Constitutional: He is oriented to person, place, and time. He appears well-developed and well-nourished. No distress.  Resting comfortably  HENT:  Head: Normocephalic and atraumatic.  Moist mucous membranes  Eyes: Conjunctivae are normal. Pupils are equal, round, and reactive to light.  Neck: Neck supple.  Cardiovascular: Regular rhythm.  Bradycardia present.   Murmur heard.  Systolic murmur is present with a grade of 2/6  Pulmonary/Chest: Effort normal and breath sounds normal.  Abdominal: Soft. Bowel sounds are normal. He exhibits no distension and no mass. There is tenderness (L mid abdominal and LLQ). There is no rebound and no guarding.  Musculoskeletal: He exhibits no edema.  Neurological: He is alert and  oriented to person, place, and time.  Fluent speech  Skin: Skin is warm and dry.  Psychiatric: He has a normal mood and affect. Judgment normal.  Nursing note and vitals reviewed.    ED Treatments / Results  Labs (all labs ordered are listed, but only abnormal results are displayed) Labs Reviewed  URINALYSIS, ROUTINE W REFLEX MICROSCOPIC - Abnormal; Notable for the following:       Result Value   Hgb urine dipstick MODERATE (*)    Ketones, ur 5 (*)    All other components within normal limits  BASIC METABOLIC PANEL - Abnormal; Notable for the following:    Glucose, Bld 125 (*)    BUN 22 (*)    GFR calc non Af Amer 53 (*)    All other components within normal limits  CBC - Abnormal; Notable for the following:    WBC 10.7 (*)    All other components within normal limits    EKG  EKG Interpretation None       Radiology US Renal  Result Date: 09/16/2016 CLINICAL DATA:  Left flank pain radiating to the groin, 1 day duration. History of kidney stones. EXAM: RENAL / URINARY TRACT ULTRASOUND COMPLETE COMPARISON:  Ultrasound 05/16/2012 FINDINGS: Right Kidney: Length: 8.5 cm. Increased cortical echogenicity. Multiple benign appearing cysts, largest measuring 3.8 x 3.6 x 4.1 cm. No evidence of stone or hydronephrosis. Left Kidney: Length: 13.2 cm. Dilatation of the renal collecting system suggests the presence of possible ureteral obstruction. Echogenic foci in the left kidney consistent with multiple renal calculi. Bladder: No ureteral jet seen on the left. Probable stone or stones in the bladder. Enlarged prostate. IMPRESSION: Right renal atrophy. Multiple left renal calculi. Left hydronephrosis. No left ureteral jet seen. Findings suggest passing stone on the left. Stones present within the bladder. Electronically Signed   By: Nelson Chimes M.D.   On: 09/16/2016 09:21    Procedures Procedures (including critical care time)  Medications Ordered in ED Medications  sodium chloride 0.9  % bolus 1,000 mL (0 mLs Intravenous Stopped 09/16/16 0922)  fentaNYL (SUBLIMAZE) injection 50 mcg (50 mcg Intravenous Given 09/16/16 0749)  morphine 2 MG/ML injection 4 mg (4 mg Intravenous Given 09/16/16 0908)  ketorolac (TORADOL) 30 MG/ML injection 30 mg (30 mg Intravenous Given 09/16/16 1009)  oxyCODONE-acetaminophen (PERCOCET/ROXICET) 5-325 MG per tablet 2 tablet (2 tablets Oral Given 09/16/16 1009)     Initial Impression / Assessment and Plan / ED Course  I have reviewed the triage vital signs and the nursing notes.  Pertinent labs & imaging results that were available during my care of the patient were reviewed by me and considered in my medical decision making (see chart for details).    PT w/ long hx of kidney stones, p/w L flank pain similar to previous  kidney stone episodes. He was uncomfortable but nontoxic on exam with normal vital signs. L lower abdominal tenderness but predominantly complaining of flank pain radiating into LLQ. UA with blood, no evidence of infection. Creatinine reassuring at 1.22. Renal US shows L hydronephrosis and no jet suggestive of ureteral stone. After receiving above medications, the patient was sitting up in bed, ambulatory, and stating that he felt better and wanted to go home. He has no evidence of infection or renal failure and I feel this is appropriate given his well appearance. Provided with pain medication, Zofran, and Flomax. He will follow-up with urology and understands return precautions. Patient discharged in satisfactory condition.  Final Clinical Impressions(s) / ED Diagnoses   Final diagnoses:  Kidney stone  Other microscopic hematuria    New Prescriptions Discharge Medication List as of 09/16/2016 11:43 AM    START taking these medications   Details  ondansetron (ZOFRAN ODT) 4 MG disintegrating tablet Take 1 tablet (4 mg total) by mouth every 8 (eight) hours as needed for nausea or vomiting., Starting Sat 09/16/2016, Print      oxyCODONE-acetaminophen (PERCOCET) 5-325 MG tablet Take 1-2 tablets by mouth every 4 (four) hours as needed., Starting Sat 09/16/2016, Print         Little, Wenda Overland, MD 09/17/16 [phone]

## 2016-09-16 NOTE — ED Notes (Signed)
ED Provider at bedside. 

## 2016-09-21 ENCOUNTER — Emergency Department (HOSPITAL_COMMUNITY)
Admission: EM | Admit: 2016-09-21 | Discharge: 2016-09-21 | Disposition: A | Discharge status: Home / Self Care | Payer: Medicare Other | Attending: Emergency Medicine | Admitting: Emergency Medicine

## 2016-09-21 ENCOUNTER — Encounter (HOSPITAL_COMMUNITY): Payer: Self-pay | Admitting: Emergency Medicine

## 2016-09-21 ENCOUNTER — Emergency Department (HOSPITAL_COMMUNITY): Payer: Medicare Other

## 2016-09-21 DIAGNOSIS — F17290 Nicotine dependence, other tobacco product, uncomplicated: Secondary | ICD-10-CM | POA: Insufficient documentation

## 2016-09-21 DIAGNOSIS — R1032 Left lower quadrant pain: Secondary | ICD-10-CM | POA: Diagnosis present

## 2016-09-21 DIAGNOSIS — N201 Calculus of ureter: Secondary | ICD-10-CM | POA: Insufficient documentation

## 2016-09-21 DIAGNOSIS — Z7982 Long term (current) use of aspirin: Secondary | ICD-10-CM | POA: Diagnosis not present

## 2016-09-21 DIAGNOSIS — Z79899 Other long term (current) drug therapy: Secondary | ICD-10-CM | POA: Diagnosis not present

## 2016-09-21 DIAGNOSIS — Z8546 Personal history of malignant neoplasm of prostate: Secondary | ICD-10-CM | POA: Insufficient documentation

## 2016-09-21 LAB — URINALYSIS, ROUTINE W REFLEX MICROSCOPIC
Bacteria, UA: NONE SEEN
Bilirubin Urine: NEGATIVE
GLUCOSE, UA: NEGATIVE mg/dL
Hgb urine dipstick: NEGATIVE
KETONES UR: 20 mg/dL — AB
Nitrite: NEGATIVE
PH: 5 (ref 5.0–8.0)
PROTEIN: NEGATIVE mg/dL
Specific Gravity, Urine: 1.021 (ref 1.005–1.030)

## 2016-09-21 LAB — CBC
HEMATOCRIT: 41.1 % (ref 39.0–52.0)
Hemoglobin: 13.8 g/dL (ref 13.0–17.0)
MCH: 31.4 pg (ref 26.0–34.0)
MCHC: 33.6 g/dL (ref 30.0–36.0)
MCV: 93.4 fL (ref 78.0–100.0)
PLATELETS: 180 10*3/uL (ref 150–400)
RBC: 4.4 MIL/uL (ref 4.22–5.81)
RDW: 13 % (ref 11.5–15.5)
WBC: 10.7 10*3/uL — ABNORMAL HIGH (ref 4.0–10.5)

## 2016-09-21 LAB — BASIC METABOLIC PANEL
ANION GAP: 10 (ref 5–15)
BUN: 27 mg/dL — ABNORMAL HIGH (ref 6–20)
CALCIUM: 9.2 mg/dL (ref 8.9–10.3)
CO2: 24 mmol/L (ref 22–32)
Chloride: 104 mmol/L (ref 101–111)
Creatinine, Ser: 1.77 mg/dL — ABNORMAL HIGH (ref 0.61–1.24)
GFR calc Af Amer: 39 mL/min — ABNORMAL LOW (ref >60)
GFR, EST NON AFRICAN AMERICAN: 34 mL/min — AB (ref >60)
GLUCOSE: 116 mg/dL — AB (ref 65–99)
Potassium: 4.1 mmol/L (ref 3.5–5.1)
Sodium: 138 mmol/L (ref 135–145)

## 2016-09-21 MED ORDER — MORPHINE SULFATE (PF) 2 MG/ML IV SOLN
6.0000 mg | Freq: Once | INTRAVENOUS | Status: AC
  Administered 2016-09-21: 6 mg via INTRAVENOUS
  Filled 2016-09-21: qty 3

## 2016-09-21 MED ORDER — ONDANSETRON HCL 4 MG/2ML IJ SOLN
4.0000 mg | Freq: Once | INTRAMUSCULAR | Status: AC
  Administered 2016-09-21: 4 mg via INTRAVENOUS
  Filled 2016-09-21: qty 2

## 2016-09-21 MED ORDER — OXYCODONE-ACETAMINOPHEN 5-325 MG PO TABS: 1.0000 | ORAL_TABLET | ORAL | 0 refills | Status: DC | PRN

## 2016-09-21 MED ORDER — ONDANSETRON 4 MG PO TBDP: 4.0000 mg | ORAL_TABLET | Freq: Three times a day (TID) | ORAL | 0 refills | Status: DC | PRN

## 2016-09-21 MED ORDER — OXYCODONE-ACETAMINOPHEN 5-325 MG PO TABS
1.0000 | ORAL_TABLET | Freq: Once | ORAL | Status: AC
  Administered 2016-09-21: 1 via ORAL
  Filled 2016-09-21: qty 1

## 2016-09-21 NOTE — ED Provider Notes (Signed)
[facility] DEPT Provider Note   CSN: [id number] Arrival date & time: 09/21/16  0807     History   Chief Complaint Chief Complaint  Patient presents with  . Flank Pain    HPI [name] is a 83 y.o. male.  HPI Patient reports ongoing left lower abdominal discomfort and pain. His pain worsened today. He stood oxycodone with some improvement. He was recently diagnosed with a left ureteral stone with an ultrasound demonstrating left-sided hydronephrosis. Patient has a long-standing history of recurrent stone disease. He follows with [facility]. He reports nausea without vomiting. Denies diarrhea. No fevers or chills. No urinary complaints. He reports the majority of his pain is located in his left lower quadrant at this time and radiates towards his groin. His pain is moderate to severe at this time   Past Medical History:  Diagnosis Date  . Arthritis   . Frequency of urination   . History of kidney stones   . History of pulmonary embolus (PE)    November 2000 unprovoked  . Iron deficiency anemia   . Nocturia   . Prostate carcinoma ([place]) DX  MARCH 2011-  FOLLOWED BY DR NESI   Gleason 3+3 06/29/09 Rx observation  . Recurrent nephrolithiasis 07/11/2011  . Right ureteral stone     Patient Active Problem List   Diagnosis Date Noted  . History of pulmonary embolus (PE) 07/11/2011  . Prostate carcinoma ([place]) 07/11/2011  . Recurrent nephrolithiasis 07/11/2011  . Iron deficiency anemia 07/11/2011    Past Surgical History:  Procedure Laterality Date  . CYSTO/ LEFT RETROGRADE PYELOGRAM/ LEFT STENT PLACEMENT  09-27-2009  . EXTRACORPOREAL SHOCK WAVE LITHOTRIPSY  JUNE 2011  . LEFT URETEROSCOPY STONE EXTRACTION AND REMOVAL BLADDER STONE  05-10-2010       Home Medications    Prior to Admission medications   Medication Sig Start Date End Date Taking? Authorizing Provider  aspirin 81 MG tablet Take 81 mg by mouth daily.   Yes [provider]  Multiple  Vitamins-Minerals (PRESERVISION/LUTEIN PO) Take 1 tablet by mouth daily.   Yes [provider]  ondansetron (ZOFRAN ODT) 4 MG disintegrating tablet Take 1 tablet (4 mg total) by mouth every 8 (eight) hours as needed for nausea or vomiting. 09/16/16  Yes Little, Wenda Overland, MD  oxyCODONE-acetaminophen (PERCOCET) 5-325 MG tablet Take 1-2 tablets by mouth every 4 (four) hours as needed. 09/16/16  Yes Little, Wenda Overland, MD  iron polysaccharides (POLY-IRON 150) 150 MG capsule Take 1 capsule (150 mg total) by mouth daily. Patient not taking: Reported on 09/21/2016 07/23/12   Annia Belt, MD  tamsulosin (FLOMAX) 0.4 MG CAPS capsule Take 1 capsule (0.4 mg total) by mouth daily. 09/16/16   Little, Wenda Overland, MD    Family History No family history on file.  Social History Social History  Substance Use Topics  . Smoking status: Current Every Day Smoker    Years: 60.00    Types: Pipe  . Smokeless tobacco: Never Used  . Alcohol use No     Allergies   Patient has no known allergies.   Review of Systems Review of Systems  All other systems reviewed and are negative.    Physical Exam Updated Vital Signs BP (!) 149/96 (BP Location: Right Arm)   Pulse 96   Temp 98.3 F (36.8 C) (Oral)   Resp 16   Ht 5\' 10"  (1.778 m)   Wt 74.8 kg (165 lb)   SpO2 93%   BMI 23.68  kg/m   Physical Exam  Constitutional: He is oriented to person, place, and time. He appears well-developed and well-nourished.  HENT:  Head: Normocephalic and atraumatic.  Eyes: EOM are normal.  Neck: Normal range of motion.  Cardiovascular: Normal rate and regular rhythm.   Pulmonary/Chest: Effort normal and breath sounds normal. No respiratory distress.  Abdominal: Soft. He exhibits no distension. There is no tenderness.  Musculoskeletal: Normal range of motion.  Neurological: He is alert and oriented to person, place, and time.  Skin: Skin is warm and dry.  Psychiatric: He has a normal mood and  affect. Judgment normal.  Nursing note and vitals reviewed.    ED Treatments / Results  Labs (all labs ordered are listed, but only abnormal results are displayed) Labs Reviewed  CBC - Abnormal; Notable for the following:       Result Value   WBC 10.7 (*)    All other components within normal limits  BASIC METABOLIC PANEL - Abnormal; Notable for the following:    Glucose, Bld 116 (*)    BUN 27 (*)    Creatinine, Ser 1.77 (*)    GFR calc non Af Amer 34 (*)    GFR calc Af Amer 39 (*)    All other components within normal limits  URINALYSIS, ROUTINE W REFLEX MICROSCOPIC - Abnormal; Notable for the following:    Ketones, ur 20 (*)    Leukocytes, UA TRACE (*)    Squamous Epithelial / LPF 0-5 (*)    All other components within normal limits    EKG  EKG Interpretation None       Radiology Ct Renal Stone Study  Result Date: 09/21/2016 CLINICAL DATA:  Left-sided pain EXAM: CT ABDOMEN AND PELVIS WITHOUT CONTRAST TECHNIQUE: Multidetector CT imaging of the abdomen and pelvis was performed following the standard protocol without oral or intravenous contrast material administration. COMPARISON:  January 01, 2012 FINDINGS: Lower chest: There is scarring with atelectatic change in the right lung base. There is slight fibrotic change in the posterior left base. There is mild pericardial thickening. There are scattered foci of coronary artery calcification. Hepatobiliary: There are multiple cysts in the liver, also present previously. Largest cyst is in the right lobe of the liver somewhat posterior in location measuring 3.4 x 2.1 cm. The next largest cyst is in the medial segment of the left lobe measuring 2.2 x 2.2 cm. The gallbladder appears mildly distended. There is a gallstone in the neck of the gallbladder measuring 1.3 cm in length. Gallbladder wall does not appear appreciably thickened. There is no biliary duct dilatation. Pancreas: There is no pancreatic mass or inflammatory focus.  Spleen: No splenic lesions are evident. Adrenals/Urinary Tract: Adrenals appear normal. There is marked hydronephrosis on the left with edema involving the left kidney. The right kidney is not hydronephrotic. There is a cyst arising from the upper pole of the right kidney measuring 4.6 x 4.0 cm. There is a cyst arising from the lower pole of the right kidney measuring 2.0 x 1 3 cm. There is a cyst arising from the lower pole left kidney measuring 4.4 x 4.2 cm. There is a cyst arising from the medial left kidney measuring 2.7 x 1.7 cm. There is a cyst in the anterior mid right kidney measuring 1.6 x 1.1 cm. 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 There is a nonobstructing 2 mm calculus in the lower pole the right kidney. There is a  tiny calculus in the upper pole the right kidney. There is a calculus in the upper pole of the left kidney measuring 9 x 9 mm. There is a 5 x 4 mm calculus posterior mid left kidney. There are scattered tiny calculi throughout the left kidney as well. There is a calculus in the distal left ureter measuring 9 x 6 mm causing the high-grade obstruction on the left. This calculus is best appreciated on axial slice 82 series 2. There are several nearby phleboliths the distal left pelvis. There are calculi within the posterior aspect of the urinary bladder. In the posterior leftward aspect of the urinary bladder, there is a calculus measuring 1.7 x 1.2 cm. There are adjacent calculi in the posterior urinary bladder measuring 1.4 x 1.3 cm and 1.4 x 1.2 cm respectively. Urinary bladder wall thickness is within normal limits. Stomach/Bowel: There is moderately diffuse stool throughout much of the colon. Cecum is mildly distended with stool. There is no appreciable bowel wall or mesenteric thickening. There is no evident bowel obstruction. No free air or portal venous air. Vascular/Lymphatic: There is atherosclerotic calcification in the aorta and iliac  arteries bilaterally. The aorta is borderline prominent distally with a maximum transverse diameter of 2.9 x 2.8 cm. There is no periaortic fluid. There is no adenopathy in the abdomen or pelvis. Reproductive: Prostate appears enlarged. There are prostatic calculi. Prostate indents upon the inferior urinary bladder. Seminal vesicles appear unremarkable. No well-defined pelvic mass. Other: There is no periappendiceal region inflammation. There is no ascites or abscess in the abdomen pelvis. There are small inguinal hernias bilaterally containing only fat. Musculoskeletal: There is degenerative change throughout the lumbar spine. There are no blastic or lytic bone lesions. There is no intramuscular or abdominal wall lesion. IMPRESSION: Severe hydronephrosis on the left with a 9 x 9 mm calculus just proximal to the ureterovesical junction on the left. There are 3 distinct calculi in the posterior urinary bladder, largest measuring 1.7 x 1.2 cm. There are nonobstructing calculi in each kidney. Multiple renal cysts are noted. There is cholelithiasis with a gallstone in the neck of the gallbladder. Gallbladder appears mildly distended without demonstrable wall thickening by CT. Prostatic calculi present. Prostate is enlarged with impression on the inferior urinary bladder. Advise correlation with PSA. No bowel obstruction. No abscess. No periappendiceal region inflammatory change. There is aortoiliac atherosclerosis. Distal aorta measures 2.9 x 2.8 cm. Ectatic abdominal aorta at risk for aneurysm development. Recommend followup by ultrasound in 5 years. This recommendation follows ACR consensus guidelines: White Paper of the ACR Incidental Findings Committee II on Vascular Findings. J Am Coll Radiol 2013; 10:789-794. There also foci of coronary artery calcification. There are small inguinal hernias containing only fat bilaterally. Electronically Signed   By: Lowella Grip III M.D.   On: 09/21/2016 09:57     Procedures Procedures (including critical care time)  Medications Ordered in ED Medications  morphine 2 MG/ML injection 6 mg (not administered)  ondansetron (ZOFRAN) injection 4 mg (not administered)     Initial Impression / Assessment and Plan / ED Course  I have reviewed the triage vital signs and the nursing notes.  Pertinent labs & imaging results that were available during my care of the patient were reviewed by me and considered in my medical decision making (see chart for details).     11:24 AM Patient improved at this time. Patient will likely need outpatient urology follow-up. Given the recurrent ER visits in size of stone analysis the  case briefly with urology to see if we can expedite follow-up  1:27 PM Pain under control now. I discussed his case with Dr. Alyson Ingles, urology who will see the patient in follow-up on Tuesday morning. Patient and family understand to return to the emergency department for new or worsening symptoms  Final Clinical Impressions(s) / ED Diagnoses   Final diagnoses:  Left ureteral stone    New Prescriptions Current Discharge Medication List       Jola Schmidt, MD 09/21/16 1328

## 2016-09-21 NOTE — ED Triage Notes (Signed)
Patient here with complaints of left sided flank pain radiating around into abdomen. Took oxycodone and zofran with no relief.

## 2016-09-22 ENCOUNTER — Other Ambulatory Visit: Payer: Self-pay | Admitting: Urology

## 2016-09-25 ENCOUNTER — Encounter (HOSPITAL_BASED_OUTPATIENT_CLINIC_OR_DEPARTMENT_OTHER): Payer: Self-pay | Admitting: *Deleted

## 2016-09-25 NOTE — Progress Notes (Signed)
NPO AFTER MN W/ EXCEPTION CLEAR  LIQUIDS UNTIL 0630 (NO CREAM/ MILK PRODUCTS).  ARRIVE AT 1100.  NEEDS HG.  MAY TAKE PAIN/ NAUSEA RX IF NEEDED AM DOS W/ SIPS OF WATER.

## 2016-09-28 ENCOUNTER — Ambulatory Visit (HOSPITAL_BASED_OUTPATIENT_CLINIC_OR_DEPARTMENT_OTHER)
Admission: RE | Admit: 2016-09-28 | Discharge: 2016-09-28 | Disposition: A | Discharge status: Home / Self Care | Payer: Medicare Other | Source: Ambulatory Visit | Attending: Urology | Admitting: Urology

## 2016-09-28 ENCOUNTER — Ambulatory Visit (HOSPITAL_BASED_OUTPATIENT_CLINIC_OR_DEPARTMENT_OTHER): Payer: Medicare Other | Admitting: Anesthesiology

## 2016-09-28 ENCOUNTER — Encounter (HOSPITAL_BASED_OUTPATIENT_CLINIC_OR_DEPARTMENT_OTHER)
Admission: RE | Disposition: A | Discharge status: Home / Self Care | Payer: Self-pay | Source: Ambulatory Visit | Attending: Urology

## 2016-09-28 ENCOUNTER — Encounter (HOSPITAL_BASED_OUTPATIENT_CLINIC_OR_DEPARTMENT_OTHER): Payer: Self-pay | Admitting: *Deleted

## 2016-09-28 DIAGNOSIS — Z79899 Other long term (current) drug therapy: Secondary | ICD-10-CM | POA: Diagnosis not present

## 2016-09-28 DIAGNOSIS — Z8546 Personal history of malignant neoplasm of prostate: Secondary | ICD-10-CM | POA: Diagnosis not present

## 2016-09-28 DIAGNOSIS — Z7982 Long term (current) use of aspirin: Secondary | ICD-10-CM | POA: Diagnosis not present

## 2016-09-28 DIAGNOSIS — F17290 Nicotine dependence, other tobacco product, uncomplicated: Secondary | ICD-10-CM | POA: Diagnosis not present

## 2016-09-28 DIAGNOSIS — N400 Enlarged prostate without lower urinary tract symptoms: Secondary | ICD-10-CM | POA: Insufficient documentation

## 2016-09-28 DIAGNOSIS — N132 Hydronephrosis with renal and ureteral calculous obstruction: Secondary | ICD-10-CM | POA: Insufficient documentation

## 2016-09-28 DIAGNOSIS — Z87442 Personal history of urinary calculi: Secondary | ICD-10-CM | POA: Diagnosis not present

## 2016-09-28 DIAGNOSIS — Z85828 Personal history of other malignant neoplasm of skin: Secondary | ICD-10-CM | POA: Insufficient documentation

## 2016-09-28 DIAGNOSIS — Z86711 Personal history of pulmonary embolism: Secondary | ICD-10-CM | POA: Insufficient documentation

## 2016-09-28 DIAGNOSIS — N210 Calculus in bladder: Secondary | ICD-10-CM | POA: Insufficient documentation

## 2016-09-28 HISTORY — PX: HOLMIUM LASER APPLICATION: SHX5852

## 2016-09-28 HISTORY — DX: Cyst of kidney, acquired: N28.1

## 2016-09-28 HISTORY — DX: Elevated prostate specific antigen (PSA): R97.20

## 2016-09-28 HISTORY — DX: Personal history of other malignant neoplasm of skin: Z85.828

## 2016-09-28 HISTORY — PX: CYSTOSCOPY WITH LITHOLAPAXY: SHX1425

## 2016-09-28 HISTORY — DX: Presence of external hearing-aid: Z97.4

## 2016-09-28 HISTORY — DX: Presence of spectacles and contact lenses: Z97.3

## 2016-09-28 HISTORY — DX: Calculus of gallbladder without cholecystitis without obstruction: K80.20

## 2016-09-28 HISTORY — PX: CYSTOSCOPY WITH RETROGRADE PYELOGRAM, URETEROSCOPY AND STENT PLACEMENT: SHX5789

## 2016-09-28 HISTORY — DX: Calculus of ureter: N20.1

## 2016-09-28 HISTORY — DX: Personal history of diseases of the blood and blood-forming organs and certain disorders involving the immune mechanism: Z86.2

## 2016-09-28 HISTORY — DX: Anemia, unspecified: D64.9

## 2016-09-28 HISTORY — DX: Calculus in bladder: N21.0

## 2016-09-28 HISTORY — DX: Benign prostatic hyperplasia without lower urinary tract symptoms: N40.0

## 2016-09-28 HISTORY — DX: Personal history of (healed) traumatic fracture: Z87.81

## 2016-09-28 HISTORY — DX: Calculus of kidney: N20.0

## 2016-09-28 HISTORY — DX: Unspecified symptoms and signs involving the genitourinary system: R39.9

## 2016-09-28 LAB — POCT HEMOGLOBIN-HEMACUE: Hemoglobin: 12.3 g/dL — ABNORMAL LOW (ref 13.0–17.0)

## 2016-09-28 SURGERY — CYSTOSCOPY, WITH BLADDER CALCULUS LITHOLAPAXY
Anesthesia: General

## 2016-09-28 MED ORDER — LIDOCAINE 2% (20 MG/ML) 5 ML SYRINGE
INTRAMUSCULAR | Status: AC
  Filled 2016-09-28: qty 5

## 2016-09-28 MED ORDER — DEXAMETHASONE SODIUM PHOSPHATE 10 MG/ML IJ SOLN
INTRAMUSCULAR | Status: AC
  Filled 2016-09-28: qty 1

## 2016-09-28 MED ORDER — PROPOFOL 10 MG/ML IV BOLUS
INTRAVENOUS | Status: DC | PRN
  Administered 2016-09-28: 150 mg via INTRAVENOUS

## 2016-09-28 MED ORDER — EPHEDRINE SULFATE 50 MG/ML IJ SOLN
INTRAMUSCULAR | Status: DC | PRN
  Administered 2016-09-28: 20 mg via INTRAVENOUS
  Administered 2016-09-28: 10 mg via INTRAVENOUS

## 2016-09-28 MED ORDER — LIDOCAINE 2% (20 MG/ML) 5 ML SYRINGE
INTRAMUSCULAR | Status: DC | PRN
  Administered 2016-09-28: 80 mg via INTRAVENOUS

## 2016-09-28 MED ORDER — TAMSULOSIN HCL 0.4 MG PO CAPS: 0.4000 mg | ORAL_CAPSULE | Freq: Every day | ORAL | 0 refills | Status: AC

## 2016-09-28 MED ORDER — EPHEDRINE 5 MG/ML INJ
INTRAVENOUS | Status: AC
  Filled 2016-09-28: qty 10

## 2016-09-28 MED ORDER — ONDANSETRON HCL 4 MG/2ML IJ SOLN
INTRAMUSCULAR | Status: DC | PRN
  Administered 2016-09-28: 4 mg via INTRAVENOUS

## 2016-09-28 MED ORDER — OXYCODONE-ACETAMINOPHEN 5-325 MG PO TABS: 1.0000 | ORAL_TABLET | ORAL | 0 refills | Status: DC | PRN

## 2016-09-28 MED ORDER — DEXAMETHASONE SODIUM PHOSPHATE 4 MG/ML IJ SOLN
INTRAMUSCULAR | Status: DC | PRN
  Administered 2016-09-28: 5 mg via INTRAVENOUS

## 2016-09-28 MED ORDER — FENTANYL CITRATE (PF) 100 MCG/2ML IJ SOLN
25.0000 ug | INTRAMUSCULAR | Status: DC | PRN
  Filled 2016-09-28: qty 1

## 2016-09-28 MED ORDER — PROPOFOL 10 MG/ML IV BOLUS
INTRAVENOUS | Status: AC
  Filled 2016-09-28: qty 20

## 2016-09-28 MED ORDER — FENTANYL CITRATE (PF) 100 MCG/2ML IJ SOLN
INTRAMUSCULAR | Status: AC
  Filled 2016-09-28: qty 2

## 2016-09-28 MED ORDER — CEFAZOLIN SODIUM-DEXTROSE 2-4 GM/100ML-% IV SOLN
INTRAVENOUS | Status: AC
  Filled 2016-09-28: qty 100

## 2016-09-28 MED ORDER — ONDANSETRON HCL 4 MG/2ML IJ SOLN
INTRAMUSCULAR | Status: AC
  Filled 2016-09-28: qty 2

## 2016-09-28 MED ORDER — CEFAZOLIN SODIUM-DEXTROSE 2-4 GM/100ML-% IV SOLN
2.0000 g | INTRAVENOUS | Status: AC
  Administered 2016-09-28: 2 g via INTRAVENOUS
  Filled 2016-09-28: qty 100

## 2016-09-28 MED ORDER — LACTATED RINGERS IV SOLN
INTRAVENOUS | Status: DC
  Administered 2016-09-28 (×2): via INTRAVENOUS
  Filled 2016-09-28: qty 1000

## 2016-09-28 MED ORDER — FENTANYL CITRATE (PF) 100 MCG/2ML IJ SOLN
INTRAMUSCULAR | Status: DC | PRN
  Administered 2016-09-28 (×2): 25 ug via INTRAVENOUS
  Administered 2016-09-28 (×2): 12.5 ug via INTRAVENOUS
  Administered 2016-09-28 (×3): 25 ug via INTRAVENOUS

## 2016-09-28 MED ORDER — ONDANSETRON HCL 4 MG/2ML IJ SOLN
4.0000 mg | Freq: Once | INTRAMUSCULAR | Status: DC | PRN
  Filled 2016-09-28: qty 2

## 2016-09-28 SURGICAL SUPPLY — 31 items
BAG DRAIN URO-CYSTO SKYTR STRL (DRAIN) ×4 IMPLANT
BASKET DAKOTA 1.9FR 11X120 (BASKET) IMPLANT
BASKET LASER NITINOL 1.9FR (BASKET) IMPLANT
BASKET ZERO TIP NITINOL 2.4FR (BASKET) IMPLANT
CATH FOLEY 2WAY SLVR  5CC 18FR (CATHETERS) ×2
CATH FOLEY 2WAY SLVR 5CC 18FR (CATHETERS) ×2 IMPLANT
CATH INTERMIT  6FR 70CM (CATHETERS) ×8 IMPLANT
CLOTH BEACON ORANGE TIMEOUT ST (SAFETY) ×4 IMPLANT
EVACUATOR MICROVAS BLADDER (UROLOGICAL SUPPLIES) IMPLANT
EXTRACTOR STONE NITINOL NGAGE (UROLOGICAL SUPPLIES) ×4 IMPLANT
FIBER LASER FLEXIVA 1000 (UROLOGICAL SUPPLIES) ×4 IMPLANT
FIBER LASER FLEXIVA 200 (UROLOGICAL SUPPLIES) IMPLANT
FIBER LASER FLEXIVA 365 (UROLOGICAL SUPPLIES) IMPLANT
FIBER LASER FLEXIVA 550 (UROLOGICAL SUPPLIES) IMPLANT
FIBER LASER TRAC TIP (UROLOGICAL SUPPLIES) IMPLANT
GLOVE BIO SURGEON STRL SZ8 (GLOVE) ×4 IMPLANT
GOWN STRL REUS W/ TWL LRG LVL3 (GOWN DISPOSABLE) ×2 IMPLANT
GOWN STRL REUS W/ TWL XL LVL3 (GOWN DISPOSABLE) ×2 IMPLANT
GOWN STRL REUS W/TWL LRG LVL3 (GOWN DISPOSABLE) ×2
GOWN STRL REUS W/TWL XL LVL3 (GOWN DISPOSABLE) ×2
GUIDEWIRE ANG ZIPWIRE 038X150 (WIRE) ×4 IMPLANT
GUIDEWIRE STR DUAL SENSOR (WIRE) IMPLANT
IV NS IRRIG 3000ML ARTHROMATIC (IV SOLUTION) ×12 IMPLANT
KIT RM TURNOVER CYSTO AR (KITS) ×4 IMPLANT
MANIFOLD NEPTUNE II (INSTRUMENTS) ×4 IMPLANT
PACK CYSTO (CUSTOM PROCEDURE TRAY) ×4 IMPLANT
STENT URET 6FRX26 CONTOUR (STENTS) ×4 IMPLANT
SYRINGE 10CC LL (SYRINGE) ×4 IMPLANT
TUBE CONNECTING 12'X1/4 (SUCTIONS)
TUBE CONNECTING 12X1/4 (SUCTIONS) IMPLANT
TUBE FEEDING 8FR 16IN STR KANG (MISCELLANEOUS) IMPLANT

## 2016-09-28 NOTE — Brief Op Note (Signed)
09/28/2016  2:25 PM  PATIENT:  Tonny Bollman.  83 y.o. male  PRE-OPERATIVE DIAGNOSIS:  BLADDER STONE, LEFT DISTAL URETERAL STONE  POST-OPERATIVE DIAGNOSIS:  BLADDER STONE, LEFT DISTAL URETERAL STONE  PROCEDURE:  Procedure(s): CYSTOSCOPY WITH LITHOLAPAXY (N/A) CYSTOSCOPY WITH RETROGRADE PYELOGRAM, URETEROSCOPY AND STENT PLACEMENT (Left) HOLMIUM LASER APPLICATION (Left)  SURGEON:  Surgeon(s) and Role:    * Reiko Vinje, Candee Furbish, MD - Primary  PHYSICIAN ASSISTANT:   ASSISTANTS: none   ANESTHESIA:   general  EBL:  Total I/O In: 200 [I.V.:200] Out: -   BLOOD ADMINISTERED:none  DRAINS: Urinary Catheter (Foley) and left 6x26 JJ ureteral stent   LOCAL MEDICATIONS USED:  NONE  SPECIMEN:  Source of Specimen:  bladder calculi and left ureteral calculus  DISPOSITION OF SPECIMEN:  PATHOLOGY  COUNTS:  YES  TOURNIQUET:  * No tourniquets in log *  DICTATION: .Note written in EPIC  PLAN OF CARE: Discharge to home after PACU  PATIENT DISPOSITION:  PACU - hemodynamically stable.   Delay start of Pharmacological VTE agent (>24hrs) due to surgical blood loss or risk of bleeding: not applicable

## 2016-09-28 NOTE — Anesthesia Preprocedure Evaluation (Signed)
Anesthesia Evaluation  Patient identified by MRN, date of birth, ID band Patient awake    Reviewed: Allergy & Precautions, NPO status , Patient's Chart, lab work & pertinent test results  Airway Mallampati: III   Neck ROM: Full  Mouth opening: Limited Mouth Opening  Dental no notable dental hx. (+) Teeth Intact   Pulmonary Current Smoker,    breath sounds clear to auscultation       Cardiovascular negative cardio ROS   Rhythm:Regular Rate:Normal     Neuro/Psych    GI/Hepatic negative GI ROS, Neg liver ROS,   Endo/Other    Renal/GU Renal disease     Musculoskeletal  (+) Arthritis ,   Abdominal   Peds  Hematology   Anesthesia Other Findings   Reproductive/Obstetrics                             Anesthesia Physical Anesthesia Plan  ASA: II  Anesthesia Plan: General   Post-op Pain Management:    Induction: Intravenous  PONV Risk Score and Plan: 2 and Ondansetron and Dexamethasone  Airway Management Planned: LMA  Additional Equipment:   Intra-op Plan:   Post-operative Plan:   Informed Consent: I have reviewed the patients History and Physical, chart, labs and discussed the procedure including the risks, benefits and alternatives for the proposed anesthesia with the patient or authorized representative who has indicated his/her understanding and acceptance.   Dental advisory given  Plan Discussed with: CRNA  Anesthesia Plan Comments:         Anesthesia Quick Evaluation

## 2016-09-28 NOTE — Progress Notes (Signed)
Attempted IV x2 without success  

## 2016-09-28 NOTE — Transfer of Care (Signed)
Immediate Anesthesia Transfer of Care Note  Patient: [name].  Procedure(s) Performed: Procedure(s) (LRB): CYSTOSCOPY WITH LITHOLAPAXY (N/A) CYSTOSCOPY WITH RETROGRADE PYELOGRAM, URETEROSCOPY AND STENT PLACEMENT (Left) HOLMIUM LASER APPLICATION (Left)  Patient Location: PACU  Anesthesia Type: General  Level of Consciousness: awake, sedated, patient cooperative and responds to stimulation  Airway & Oxygen Therapy: Patient Spontanous Breathing and Patient connected to [place] O2  Post-op Assessment: Report given to PACU RN, Post -op Vital signs reviewed and stable and Patient moving all extremities  Post vital signs: Reviewed and stable  Complications: No apparent anesthesia complications

## 2016-09-28 NOTE — Discharge Instructions (Signed)
[facility] [phone] Post Ureteroscopy With or Without Stent Instructions  Definitions:  Ureter: The duct that transports urine from the kidney to the bladder. Stent:   A plastic hollow tube that is placed into the ureter, from the kidney to the  bladder to prevent the ureter from swelling shut.  GENERAL INSTRUCTIONS:  Despite the fact that no skin incisions were used, the area around the ureter and bladder is raw and irritated. The stent is a foreign body which will further irritate the bladder wall. This irritation is manifested by increased frequency of urination, both day and night, and by an increase in the urge to urinate. In some, the urge to urinate is present almost always. Sometimes the urge is strong enough that you may not be able to stop yourself from urinating. The only real cure is to remove the stent and then give time for the bladder wall to heal which can't be done until the danger of the ureter swelling shut has passed, which varies.  You may see some blood in your urine while the stent is in place and a few days afterwards. Do not be alarmed, even if the urine was clear for a while. Get off your feet and drink lots of fluids until clearing occurs. If you start to pass clots or don't improve, call us.  DIET: You may return to your normal diet immediately. Because of the raw surface of your bladder, alcohol, spicy foods, acid type foods and drinks with caffeine may cause irritation or frequency and should be used in moderation. To keep your urine flowing freely and to avoid constipation, drink plenty of fluids during the day ( 8-10 glasses ). Tip: Avoid cranberry juice because it is very acidic.  ACTIVITY: Your physical activity doesn't need to be restricted. However, if you are very active, you may see some blood in your urine. We suggest that you reduce your activity under these circumstances until the bleeding has stopped.  BOWELS: It is  important to keep your bowels regular during the postoperative period. Straining with bowel movements can cause bleeding. A bowel movement every other day is reasonable. Use a mild laxative if needed, such as Milk of Magnesia 2-3 tablespoons, or 2 Dulcolax tablets. Call if you continue to have problems. If you have been taking narcotics for pain, before, during or after your surgery, you may be constipated. Take a laxative if necessary.   MEDICATION: You should resume your pre-surgery medications unless told not to. In addition you will often be given an antibiotic to prevent infection. These should be taken as prescribed until the bottles are finished unless you are having an unusual reaction to one of the drugs.  PROBLEMS YOU SHOULD REPORT TO [place]:  Fevers over 100.5 Fahrenheit.  Heavy bleeding, or clots ( See above notes about blood in urine ).  Inability to urinate.  Drug reactions ( hives, rash, nausea, vomiting, diarrhea ).  Severe burning or pain with urination that is not improving.  FOLLOW-UP: You will need a follow-up appointment to monitor your progress. Call for this appointment at the number listed above. Usually the first appointment will be about three to fourteen days after your surgery.      Indwelling Urinary Catheter Care, Adult  Take good care of your catheter to keep it working and to prevent problems. How to wear your catheter Attach your catheter to your leg with tape (adhesive tape) or a leg strap. Make sure it is  not too tight. If you use tape, remove any bits of tape that are already on the catheter. How to wear a drainage bag You should have:  A large overnight bag.  A small leg bag.  Overnight Bag You may wear the overnight bag at any time. Always keep the bag below the level of your bladder but off the floor. When you sleep, put a clean plastic bag in a wastebasket. Then hang the bag inside the wastebasket. Leg Bag Never wear the leg bag at night.  Always wear the leg bag below your knee. Keep the leg bag secure with a leg strap or tape. How to care for your skin  Clean the skin around the catheter at least once every day.  Shower every day. Do not take baths.  Put creams, lotions, or ointments on your genital area only as told by your doctor.  Do not use powders, sprays, or lotions on your genital area. How to clean your catheter and your skin 1. Wash your hands with soap and water. 2. Wet a washcloth in warm water and gentle (mild) soap. 3. Use the washcloth to clean the skin where the catheter enters your body. Clean downward and wipe away from the catheter in small circles. Do not wipe toward the catheter. 4. Pat the area dry with a clean towel. Make sure to clean off all soap. How to care for your drainage bags Empty your drainage bag when it is ?- full or at least 2-3 times a day. Replace your drainage bag once a month or sooner if it starts to smell bad or look dirty. Do not clean your drainage bag unless told by your doctor. Emptying a drainage bag  Supplies Needed  Rubbing alcohol.  Gauze pad or cotton ball.  Tape or a leg strap.  Steps 1. Wash your hands with soap and water. 2. Separate (detach) the bag from your leg. 3. Hold the bag over the toilet or a clean container. Keep the bag below your hips and bladder. This stops pee (urine) from going back into the tube. 4. Open the pour spout at the bottom of the bag. 5. Empty the pee into the toilet or container. Do not let the pour spout touch any surface. 6. Put rubbing alcohol on a gauze pad or cotton ball. 7. Use the gauze pad or cotton ball to clean the pour spout. 8. Close the pour spout. 9. Attach the bag to your leg with tape or a leg strap. 10. Wash your hands.  Changing a drainage bag Supplies Needed  Alcohol wipes.  A clean drainage bag.  Adhesive tape or a leg strap.  Steps 1. Wash your hands with soap and water. 2. Separate the dirty bag  from your leg. 3. Pinch the rubber catheter with your fingers so that pee does not spill out. 4. Separate the catheter tube from the drainage tube where these tubes connect (at the connection valve). Do not let the tubes touch any surface. 5. Clean the end of the catheter tube with an alcohol wipe. Use a different alcohol wipe to clean the end of the drainage tube. 6. Connect the catheter tube to the drainage tube of the clean bag. 7. Attach the new bag to the leg with adhesive tape or a leg strap. 8. Wash your hands.  How to prevent infection and other problems  Never pull on your catheter or try to remove it. Pulling can damage tissue in your body.  Always  wash your hands before and after touching your catheter.  If a leg strap gets wet, replace it with a dry one.  Drink enough fluids to keep your pee clear or pale yellow, or as told by your doctor.  Do not let the drainage bag or tubing touch the floor.  Wear cotton underwear.  If you are male, wipe from front to back after you poop (have a bowel movement).  Check on the catheter often to make sure it works and the tubing is not twisted. Get help if:  Your pee is cloudy.  Your pee smells unusually bad.  Your pee is not draining into the bag.  Your tube gets clogged.  Your catheter starts to leak.  Your bladder feels full. Get help right away if:  You have redness, swelling, or pain where the catheter enters your body.  You have fluid, pus, or a bad smell coming from the area where the catheter enters your body.  The area where the catheter enters your body feels warm.  You have a fever.  You have pain in your: ? Stomach (abdomen). ? Legs. ? Lower back. ? Bladder.  You see blood fill the catheter.  Your pee is pink or red.  You feel sick to your stomach (nauseous).  You throw up (vomit).  You have chills.  Your catheter gets pulled out. This information is not intended to replace advice given to  you by your health care provider. Make sure you discuss any questions you have with your health care provider. Document Released: 07/29/2012 Document Revised: 03/01/2016 Document Reviewed: 09/16/2013 Elsevier Interactive Patient Education  2018 [place] [facility] Instructions  Activity: Get plenty of rest for the remainder of the day. A responsible individual must stay with you for 24 hours following the procedure.  For the next 24 hours, DO NOT: -Drive a car -Paediatric nurse -Drink alcoholic beverages -Take any medication unless instructed by your physician -Make any legal decisions or sign important papers.  Meals: Start with liquid foods such as gelatin or soup. Progress to regular foods as tolerated. Avoid greasy, spicy, heavy foods. If nausea and/or vomiting occur, drink only clear liquids until the nausea and/or vomiting subsides. Call your physician if vomiting continues.  Special Instructions/Symptoms: Your throat may feel dry or sore from the anesthesia or the breathing tube placed in your throat during surgery. If this causes discomfort, gargle with warm salt water. The discomfort should disappear within 24 hours.  If you had a scopolamine patch placed behind your ear for the management of post- operative nausea and/or vomiting:  1. The medication in the patch is effective for 72 hours, after which it should be removed.  Wrap patch in a tissue and discard in the trash. Wash hands thoroughly with soap and water. 2. You may remove the patch earlier than 72 hours if you experience unpleasant side effects which may include dry mouth, dizziness or visual disturbances. 3. Avoid touching the patch. Wash your hands with soap and water after contact with the patch.

## 2016-09-28 NOTE — Anesthesia Postprocedure Evaluation (Signed)
Anesthesia Post Note  Patient: Elan Brainerd.  Procedure(s) Performed: Procedure(s) (LRB): CYSTOSCOPY WITH LITHOLAPAXY (N/A) CYSTOSCOPY WITH RETROGRADE PYELOGRAM, URETEROSCOPY AND STENT PLACEMENT (Left) HOLMIUM LASER APPLICATION (Left)     Patient location during evaluation: PACU Anesthesia Type: General Level of consciousness: awake and alert and oriented Pain management: pain level controlled Vital Signs Assessment: post-procedure vital signs reviewed and stable Respiratory status: spontaneous breathing, nonlabored ventilation and respiratory function stable Cardiovascular status: blood pressure returned to baseline and stable Postop Assessment: no signs of nausea or vomiting Anesthetic complications: no    Last Vitals:  Vitals:   09/28/16 1500 09/28/16 1515  BP: (!) 145/88 (!) 151/88  Pulse: 71 67  Resp: 19 19  Temp:      Last Pain:  Vitals:   09/28/16 1439  TempSrc:   PainSc: Asleep                 Marten Iles A.

## 2016-09-28 NOTE — Anesthesia Procedure Notes (Signed)
Procedure Name: LMA Insertion Date/Time: 09/28/2016 1:04 PM Performed by: Justice Rocher Pre-anesthesia Checklist: Patient identified, Emergency Drugs available, Suction available and Patient being monitored Patient Re-evaluated:Patient Re-evaluated prior to inductionOxygen Delivery Method: Circle system utilized Preoxygenation: Pre-oxygenation with 100% oxygen Intubation Type: IV induction Ventilation: Mask ventilation without difficulty LMA: LMA inserted LMA Size: 4.0 Number of attempts: 1 Airway Equipment and Method: Bite block Placement Confirmation: positive ETCO2 Tube secured with: Tape Dental Injury: Teeth and Oropharynx as per pre-operative assessment

## 2016-09-28 NOTE — H&P (Signed)
Urology Admission H&P  Chief Complaint: left flank pain  History of Present Illness: Mr Macaraeg is a 83yo with a hx of nephrolithaisis who developed left flank pain and was diagnosed with a 25mm left UPJ calculus and multiple large bladder calculi  Past Medical History:  Diagnosis Date  . Arthritis    right hand  . Bilateral renal cysts   . Bladder stone   . BPH with elevated PSA   . Cholelithiasis    per CT 09-21-2016  . History of fracture of rib    per pt fractured ribs, multiple, from teen, collage years , in [employer]   . History of iron deficiency anemia   . History of kidney stones   . History of pulmonary embolus (PE)    November 2000 --  completed 5 yrs anticoagulation, now ASA 81mg  since-- no recurrence  . History of skin cancer    per pt removal several times from scalp, unsure BCC or SCC  . Left ureteral stone   . Lower urinary tract symptoms (LUTS)   . Nephrolithiasis    bilateral non-obstructive per CT 09-21-2016  . Prostate carcinoma [facility]) urologist-  dr Alyson Ingles (previously Dr Janice Norrie)   dx Mar 2011-- Gleason 3+3 , PSA 7.65 --- active survillence --- last PSA Aug 2016,  6.95  . Right ureteral stone   . Wears glasses   . Wears hearing aid in both ears    Past Surgical History:  Procedure Laterality Date  . APPENDECTOMY    . CYSTO/ LEFT RETROGRADE PYELOGRAM/ LEFT STENT PLACEMENT  09-27-2009  . CYSTOSCOPY/RETROGRADE/URETEROSCOPY/STONE EXTRACTION WITH BASKET  per pt few times prior to 2011  . EXTRACORPOREAL SHOCK WAVE LITHOTRIPSY Left 09/30/2009  . EXTRACORPOREAL SHOCK WAVE LITHOTRIPSY  per pt multiple times last 50 yrs  . LEFT URETEROSCOPY STONE EXTRACTION AND REMOVAL BLADDER STONE  05-10-2010  . PERCUTANEOUS NEPHROSTOLITHOTOMY  1965    Home Medications:  Prescriptions Prior to Admission  Medication Sig Dispense Refill Last Dose  . aspirin 81 MG tablet Take 81 mg by mouth daily.   Past Week at Unknown time  . Multiple Vitamins-Minerals (CENTRUM SILVER  50+MEN) TABS Take by mouth daily.   Past Week at Unknown time  . Multiple Vitamins-Minerals (PRESERVISION/LUTEIN PO) Take 1 tablet by mouth daily.   Past Week at Unknown time  . ondansetron (ZOFRAN ODT) 4 MG disintegrating tablet Take 1 tablet (4 mg total) by mouth every 8 (eight) hours as needed for nausea or vomiting. 6 tablet 0 Past Week at Unknown time  . oxyCODONE-acetaminophen (PERCOCET) 5-325 MG tablet Take 1-2 tablets by mouth every 4 (four) hours as needed. 25 tablet 0 Past Week at Unknown time  . tamsulosin (FLOMAX) 0.4 MG CAPS capsule Take 1 capsule (0.4 mg total) by mouth daily. 14 capsule 0 Past Week at Unknown time   Allergies: No Known Allergies  History reviewed. No pertinent family history. Social History:  reports that he has been smoking Pipe.  He has smoked for the past 60.00 years. He has never used smokeless tobacco. He reports that he does not drink alcohol or use drugs.  Review of Systems  Genitourinary: Positive for flank pain, frequency, hematuria and urgency.  All other systems reviewed and are negative.   Physical Exam:  Vital signs in last 24 hours: Temp:  [97.7 F (36.5 C)] 97.7 F (36.5 C) (06/14 1104) Pulse Rate:  [75] 75 (06/14 1104) Resp:  [16] 16 (06/14 1104) BP: (136)/(77) 136/77 (06/14 1104) SpO2:  [98 %]  98 % (06/14 1104) Weight:  [69.6 kg (153 lb 8 oz)] 69.6 kg (153 lb 8 oz) (06/14 1104) Physical Exam  Constitutional: He is oriented to person, place, and time. He appears well-developed and well-nourished.  HENT:  Head: Normocephalic and atraumatic.  Eyes: EOM are normal. Pupils are equal, round, and reactive to light.  Neck: Normal range of motion. No thyromegaly present.  Cardiovascular: Normal rate and regular rhythm.   Respiratory: Effort normal. No respiratory distress.  GI: Soft. He exhibits no distension.  Musculoskeletal: Normal range of motion. He exhibits no edema.  Neurological: He is alert and oriented to person, place, and time.   Skin: Skin is warm and dry.  Psychiatric: He has a normal mood and affect. His behavior is normal. Judgment and thought content normal.    Laboratory Data:  Results for orders placed or performed during the hospital encounter of 09/28/16 (from the past 24 hour(s))  Hemoglobin-hemacue, POC     Status: Abnormal   Collection Time: 09/28/16 11:54 AM  Result Value Ref Range   Hemoglobin 12.3 (L) 13.0 - 17.0 g/dL   No results found for this or any previous visit (from the past 240 hour(s)). Creatinine: No results for input(s): CREATININE in the last 168 hours. Baseline Creatinine: unknwon  Impression/Assessment:  83yo with left ureteral calculus and multiple bladder calculi  Plan:  The risks/benefits/alternatives to cystolithalopaxy and L URS was explained to the patient and he understands and wishes to proceed with surgery  Nicolette Bang 09/28/2016, 12:44 PM

## 2016-09-29 ENCOUNTER — Encounter (HOSPITAL_BASED_OUTPATIENT_CLINIC_OR_DEPARTMENT_OTHER): Payer: Self-pay | Admitting: Urology

## 2016-10-03 NOTE — Op Note (Signed)
.  Preoperative diagnosis: Left ureteral stone, bladder calculi  Postoperative diagnosis: Same  Procedure: 1 cystoscopy 2. Left retrograde pyelography 3.  Intraoperative fluoroscopy, under one hour, with interpretation 4.  Left ureteroscopic stone manipulation with laser lithotripsy 5.  Left 6 x 26 JJ stent placement 6. cystolithalopaxy for a stone >2.5cm  Attending: Rosie Fate  Anesthesia: General  Estimated blood loss: None  Drains: Left 6 x 26 JJ ureteral stent with tether  Specimens: left ureteral calculus and bladder calculi  Antibiotics: ancef  Findings: left distal ureteral stone. Moderate hydronephrosis. No masses/lesions in the bladder. Ureteral orifices in normal anatomic location. 4 bladder calculi ranging in size from 1-3cm.  Indications: Patient is a 83 year old male with a history of left ureteral stone and who has persistent left flank pain. He also has 4 bladder stones. After discussing treatment options, she decided proceed with left ureteroscopic stone manipulation and cystolithalopaxy.  Procedure her in detail: The patient was brought to the operating room and a brief timeout was done to ensure correct patient, correct procedure, correct site.  General anesthesia was administered patient was placed in dorsal lithotomy position.  Her genitalia was then prepped and draped in usual sterile fashion.  A rigid 62 French cystoscope was passed in the urethra and the bladder.  Bladder was inspected free masses or lesions.  the ureteral orifices were in the normal orthotopic locations.  a 6 french ureteral catheter was then instilled into the left ureteral orifice.  a gentle retrograde was obtained and findings noted above.  we then placed a zip wire through the ureteral catheter and advanced up to the renal pelvis.  we then removed the cystoscope and cannulated the left ureteral orifice with a semirigid ureteroscope.  We encountered a stone at the mid ureter.   using a 200  nm laser fiber and fragmented the stone into smaller pieces.  the pieces were then removed with a Ngage basket.     once all stone fragments were removed we then placed a 6 x 26 double-j ureteral stent over the original zip wire.  We then placed and good coil was noted in the the renal pelvis under fluoroscopy and the bladder under direct vision. the stone fragments were then removed from the bladder and sent for analysis. We then used a 1000nm laser fiber to fragment 4 bladder calculi sizes 1-3cm. The fragments were then evacuated from the bladder.   the bladder was then drained and this concluded the procedure which was well tolerated by patient.  Complications: None  Condition: Stable, extubated, transferred to PACU  Plan: Patient is to be discharged home as to follow-up 3 days for foley remove and in one week for stent removal.

## 2016-10-17 ENCOUNTER — Ambulatory Visit (INDEPENDENT_AMBULATORY_CARE_PROVIDER_SITE_OTHER): Payer: 59 | Admitting: Internal Medicine

## 2016-10-17 ENCOUNTER — Ambulatory Visit (INDEPENDENT_AMBULATORY_CARE_PROVIDER_SITE_OTHER)
Admission: RE | Admit: 2016-10-17 | Discharge: 2016-10-17 | Disposition: A | Discharge status: Home / Self Care | Payer: 59 | Source: Ambulatory Visit | Attending: Internal Medicine | Admitting: Internal Medicine

## 2016-10-17 ENCOUNTER — Encounter: Payer: Self-pay | Admitting: Internal Medicine

## 2016-10-17 VITALS — BP 110/82 | Temp 98.5°F | Wt 154.6 lb

## 2016-10-17 DIAGNOSIS — C61 Malignant neoplasm of prostate: Secondary | ICD-10-CM

## 2016-10-17 DIAGNOSIS — N200 Calculus of kidney: Secondary | ICD-10-CM | POA: Diagnosis not present

## 2016-10-17 DIAGNOSIS — R634 Abnormal weight loss: Secondary | ICD-10-CM

## 2016-10-17 DIAGNOSIS — R531 Weakness: Secondary | ICD-10-CM | POA: Diagnosis not present

## 2016-10-17 DIAGNOSIS — N1330 Unspecified hydronephrosis: Secondary | ICD-10-CM

## 2016-10-17 LAB — POC URINALSYSI DIPSTICK (AUTOMATED)
BILIRUBIN UA: NEGATIVE
GLUCOSE UA: NEGATIVE
KETONES UA: NEGATIVE
NITRITE UA: NEGATIVE
PH UA: 6 (ref 5.0–8.0)
Spec Grav, UA: 1.015 (ref 1.010–1.025)
Urobilinogen, UA: 0.2 E.U./dL

## 2016-10-17 MED ORDER — CIPROFLOXACIN HCL 500 MG PO TABS: 500.0000 mg | ORAL_TABLET | Freq: Two times a day (BID) | ORAL | 0 refills | Status: DC

## 2016-10-17 NOTE — Patient Instructions (Signed)
Return in 2 weeks for follow-up Slowly advance your  level of activity  Advance diet as tolerated  Report any new or worsening symptoms

## 2016-10-17 NOTE — Progress Notes (Signed)
Subjective:    Patient ID: [name]., male    DOB: 11/16/1929, 83 y.o.   MRN: [id number]  HPI  83 year old patient who is seen today as a new patient with a chief complaint of weight loss. He states that he has had a history of recurrent renal stones since 1965.  He was seen by urology last month due to recurrent left ureteral stone with hydronephrosis.  He required cystoscopy and retrograde pyelogram lithotripsy and stent placement.  He states that the following this episode he became quite anorexic.  He required a catheter for a period of time.  He states that he lost 17 pounds initially but more recently has gained back 8 pounds.  For the past 6 days he feels that he has regained a normal appetite  He generally takes no chronic medications except for low dose aspirin He states he generally enjoys excellent health.  More recently he has been supplementing his diet with ensure.  Wt Readings from Last 3 Encounters:  10/17/16 154 lb 9.6 oz (70.1 kg)  09/28/16 153 lb 8 oz (69.6 kg)  09/21/16 165 lb (74.8 kg)    Other concerns today include:  Concerns for a UTI  bedsore from prolonged inactivity and sitting Poor energy  Urological evaluation included a CT renal stone study.  This revealed coronary artery calcifications as well as diffuse intra-abdominal calcifications.  Left hydronephrosis was noted, as well as gallstones.  He had multiple renal cysts  Review of his medical record reveals a long history of elevated PSA levels Creatinine recently increased from 1.22 with the creatinine clearance of greater than 60 2, a creatinine of 1.77 in the setting of left-sided hydronephrosis. CBC revealed a baseline hemoglobin of 13. 8, which decreased slightly to 12. 3  He has a history of prostate cancer and stable PSA.  No prior screening colonoscopies  Social history.  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  He  retired in the [employer] from a [employer].  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  Pipe smoker for 60 years  Family history.  Father died at 53 of lung cancer.  Mother died at 102 of an MI.  3 brothers, one has dementia and residing at a rest home.  One brother died in his 73 and the other in his 35 of unclear reasons  Past Medical History:  Diagnosis Date  . Anemia   . Arthritis    right hand  . Bilateral renal cysts   . Bladder stone   . BPH with elevated PSA   . Cholelithiasis    per CT 09-21-2016  . History of fracture of rib    per pt fractured ribs, multiple, from teen, collage years , in [employer]   . History of iron deficiency anemia   . History of kidney stones   . History of pulmonary embolus (PE)    November 2000 --  completed 5 yrs anticoagulation, now ASA 81mg  since-- no recurrence  . History of skin cancer    per pt removal several times from scalp, unsure BCC or SCC  . Left ureteral stone   . Lower urinary tract symptoms (LUTS)   . Nephrolithiasis    bilateral non-obstructive per CT 09-21-2016  . Prostate carcinoma [facility]) urologist-  dr Alyson Ingles (previously Dr Janice Norrie)   dx Mar 2011-- Gleason 3+3 , PSA  7.65 --- active survillence --- last PSA Aug 2016,  6.95  . Right ureteral stone   . Wears glasses   . Wears hearing aid in both ears      Social History   Social History  . Marital status: Married    Spouse name: N/A  . Number of children: N/A  . Years of education: N/A   Occupational History  . Not on file.   Social History Main Topics  . Smoking status: Current Every Day Smoker    Years: 60.00    Types: Pipe  . Smokeless tobacco: Never Used  . Alcohol use No  . Drug use: No  . Sexual activity: Not on file   Other Topics Concern  . Not on file   Social History Narrative  . No narrative on file    Past Surgical History:  Procedure Laterality Date  . APPENDECTOMY    . CYSTO/ LEFT  RETROGRADE PYELOGRAM/ LEFT STENT PLACEMENT  09-27-2009  . CYSTOSCOPY WITH LITHOLAPAXY N/A 09/28/2016   Procedure: CYSTOSCOPY WITH LITHOLAPAXY;  Surgeon: Cleon Gustin, MD;  [facility];  Service: Urology;  Laterality: N/A;  . CYSTOSCOPY WITH RETROGRADE PYELOGRAM, URETEROSCOPY AND STENT PLACEMENT Left 09/28/2016   Procedure: CYSTOSCOPY WITH RETROGRADE PYELOGRAM, URETEROSCOPY AND STENT PLACEMENT;  Surgeon: Cleon Gustin, MD;  Location: [facility];  Service: Urology;  Laterality: Left;  . CYSTOSCOPY/RETROGRADE/URETEROSCOPY/STONE EXTRACTION WITH BASKET  per pt few times prior to 2011  . EXTRACORPOREAL SHOCK WAVE LITHOTRIPSY Left 09/30/2009  . EXTRACORPOREAL SHOCK WAVE LITHOTRIPSY  per pt multiple times last 50 yrs  . HOLMIUM LASER APPLICATION Left [id number]/[id number]   Procedure: HOLMIUM LASER APPLICATION;  Surgeon: Cleon Gustin, MD;  Location: [facility];  Service: Urology;  Laterality: Left;  . LEFT URETEROSCOPY STONE EXTRACTION AND REMOVAL BLADDER STONE  05-10-2010  . PERCUTANEOUS NEPHROSTOLITHOTOMY  1965    Family History  Problem Relation Age of Onset  . Arthritis Mother   . Arthritis Father   . Cancer Father     No Known Allergies  Current Outpatient Prescriptions on File Prior to Visit  Medication Sig Dispense Refill  . aspirin 81 MG tablet Take 81 mg by mouth daily.    . Multiple Vitamins-Minerals (CENTRUM SILVER 50+MEN) TABS Take by mouth daily.    . Multiple Vitamins-Minerals (PRESERVISION/LUTEIN PO) Take 1 tablet by mouth daily.    . ondansetron (ZOFRAN ODT) 4 MG disintegrating tablet Take 1 tablet (4 mg total) by mouth every 8 (eight) hours as needed for nausea or vomiting. 6 tablet 0  . oxyCODONE-acetaminophen (PERCOCET) 5-325 MG tablet Take 1-2 tablets by mouth every 4 (four) hours as needed. 30 tablet 0  . tamsulosin (FLOMAX) 0.4 MG CAPS capsule Take 1 capsule (0.4 mg total) by mouth daily. 30 capsule 0   No  current facility-administered medications on file prior to visit.     BP 110/82 (BP Location: Left Arm, Patient Position: Sitting, Cuff Size: Normal)   Temp 98.5 F (36.9 C) (Oral)   Wt 154 lb 9.6 oz (70.1 kg)   SpO2 96%   BMI 22.18 kg/m     Review of Systems  Constitutional: Positive for activity change, appetite change, fatigue and unexpected weight change.  Genitourinary: Positive for dysuria and frequency.  Skin: Positive for wound.  Neurological: Positive for weakness.       Objective:   Physical Exam  Constitutional: He appears well-developed.  Blood pressure 100/64 Appears malnourished  HENT:  Head: Normocephalic and atraumatic.  Right Ear: External ear normal.  Left Ear: External ear normal.  Nose: Nose normal.  Mouth/Throat: Oropharynx is clear and moist.  Eyes: Conjunctivae and EOM are normal. Pupils are equal, round, and reactive to light. No scleral icterus.  Neck: Normal range of motion. Neck supple. No JVD present. No thyromegaly present.  Cardiovascular: Regular rhythm, normal heart sounds and intact distal pulses.  Exam reveals no gallop and no friction rub.   No murmur heard. Pedal pulses full except for a decreased right posterior tibial pulse  Pulmonary/Chest: Effort normal and breath sounds normal. He exhibits no tenderness.  Abdominal: Soft. Bowel sounds are normal. He exhibits no distension and no mass. There is no tenderness.  Prominent aortic pulsation Right lower quadrant and lower midline surgical scars  Genitourinary: Penis normal. Rectal exam shows guaiac negative stool.  Genitourinary Comments: Prostate enlarged Stool heme-negative  Musculoskeletal: Normal range of motion. He exhibits no edema or tenderness.  Muscle atrophy about the left shoulder and upper back  Lymphadenopathy:    He has no cervical adenopathy.  Neurological: He is alert. He has normal reflexes. No cranial nerve deficit. Coordination normal.  Decreased vibratory  sensation distally   Skin: Skin is warm and dry. No rash noted.  Erythema in the sacral area.  No ulceration  Psychiatric: He has a normal mood and affect. His behavior is normal.          Assessment & Plan:   Weight loss.  Patient feels that more recently he has regained his appetite and there has been some modest weight gain of 8 pounds.  There is no documentation of this in his chart.  He still appears quite malnourished.  Will review a chest x-ray and check screening lab Status post recent renal colic with left-sided hydronephrosis.  Status post stenting and lithotripsy Remote history of pulmonary embolism History of stable prostate cancer Acute UTI.  Will check a urine culture and treat with 5 days of Cipro.  Status post recent catheterization  Recheck 2 weeks  Genea Rheaume Pilar Plate

## 2016-10-20 LAB — URINE CULTURE

## 2016-10-31 ENCOUNTER — Encounter: Payer: Self-pay | Admitting: Internal Medicine

## 2016-10-31 ENCOUNTER — Ambulatory Visit (INDEPENDENT_AMBULATORY_CARE_PROVIDER_SITE_OTHER): Payer: 59 | Admitting: Internal Medicine

## 2016-10-31 VITALS — BP 130/76 | HR 81 | Temp 98.5°F | Resp 14 | Ht 70.0 in | Wt 162.0 lb

## 2016-10-31 DIAGNOSIS — N200 Calculus of kidney: Secondary | ICD-10-CM

## 2016-10-31 DIAGNOSIS — I700 Atherosclerosis of aorta: Secondary | ICD-10-CM

## 2016-10-31 DIAGNOSIS — N281 Cyst of kidney, acquired: Secondary | ICD-10-CM | POA: Diagnosis not present

## 2016-10-31 DIAGNOSIS — K8070 Calculus of gallbladder and bile duct without cholecystitis without obstruction: Secondary | ICD-10-CM

## 2016-10-31 DIAGNOSIS — K8020 Calculus of gallbladder without cholecystitis without obstruction: Secondary | ICD-10-CM | POA: Insufficient documentation

## 2016-10-31 DIAGNOSIS — I2510 Atherosclerotic heart disease of native coronary artery without angina pectoris: Secondary | ICD-10-CM

## 2016-10-31 DIAGNOSIS — R634 Abnormal weight loss: Secondary | ICD-10-CM

## 2016-10-31 DIAGNOSIS — C61 Malignant neoplasm of prostate: Secondary | ICD-10-CM | POA: Diagnosis not present

## 2016-10-31 DIAGNOSIS — I2584 Coronary atherosclerosis due to calcified coronary lesion: Secondary | ICD-10-CM

## 2016-10-31 LAB — PSA: PSA: 10.14 ng/mL — ABNORMAL HIGH (ref 0.10–4.00)

## 2016-10-31 LAB — COMPREHENSIVE METABOLIC PANEL
ALBUMIN: 3.9 g/dL (ref 3.5–5.2)
ALK PHOS: 62 U/L (ref 39–117)
ALT: 16 U/L (ref 0–53)
AST: 16 U/L (ref 0–37)
BILIRUBIN TOTAL: 0.6 mg/dL (ref 0.2–1.2)
BUN: 21 mg/dL (ref 6–23)
CALCIUM: 9.3 mg/dL (ref 8.4–10.5)
CO2: 29 meq/L (ref 19–32)
CREATININE: 0.84 mg/dL (ref 0.40–1.50)
Chloride: 107 mEq/L (ref 96–112)
GFR: 92.31 mL/min (ref >60.00)
Glucose, Bld: 96 mg/dL (ref 70–99)
Potassium: 4.5 mEq/L (ref 3.5–5.1)
Sodium: 142 mEq/L (ref 135–145)
TOTAL PROTEIN: 6.1 g/dL (ref 6.0–8.3)

## 2016-10-31 LAB — SEDIMENTATION RATE: SED RATE: 11 mm/h (ref 0–20)

## 2016-10-31 LAB — TSH: TSH: 2.33 u[IU]/mL (ref 0.35–4.50)

## 2016-10-31 MED ORDER — ATORVASTATIN CALCIUM 10 MG PO TABS: 10.0000 mg | ORAL_TABLET | Freq: Every day | ORAL | 3 refills | Status: AC

## 2016-10-31 NOTE — Progress Notes (Signed)
Subjective:    Patient ID: Joshua Bollman., male    DOB: [birth year], 83 y.o.   MRN: [id number]  HPI  83 year old patient who is seen today in follow-up. He was seen recently due to weight loss in the setting of obstructive uropathy and pseudomonal UTI. He is much improved.  Gaining weight.  His only complaint is low energy level that also seems to be improving His weight is essentially back to baseline  Past Medical History:  Diagnosis Date  . Anemia   . Arthritis    right hand  . Bilateral renal cysts   . Bladder stone   . BPH with elevated PSA   . Cholelithiasis    per CT 09-21-2016  . History of fracture of rib    per pt fractured ribs, multiple, from teen, collage years , in [employer]   . History of iron deficiency anemia   . History of kidney stones   . History of pulmonary embolus (PE)    November 2000 --  completed 5 yrs anticoagulation, now ASA 81mg  since-- no recurrence  . History of skin cancer    per pt removal several times from scalp, unsure BCC or SCC  . Left ureteral stone   . Lower urinary tract symptoms (LUTS)   . Nephrolithiasis    bilateral non-obstructive per CT 09-21-2016  . Prostate carcinoma [facility]) urologist-  dr Alyson Ingles (previously Dr Janice Norrie)   dx Mar 2011-- Gleason 3+3 , PSA 7.65 --- active survillence --- last PSA Aug 2016,  6.95  . Right ureteral stone   . Wears glasses   . Wears hearing aid in both ears      Social History   Social History  . Marital status: Married    Spouse name: N/A  . Number of children: N/A  . Years of education: N/A   Occupational History  . Not on file.   Social History Main Topics  . Smoking status: Current Every Day Smoker    Years: 60.00    Types: Pipe  . Smokeless tobacco: Never Used  . Alcohol use No  . Drug use: No  . Sexual activity: Not on file   Other Topics Concern  . Not on file   Social History Narrative  . No narrative on file    Past Surgical History:  Procedure Laterality  Date  . APPENDECTOMY    . CYSTO/ LEFT RETROGRADE PYELOGRAM/ LEFT STENT PLACEMENT  09-27-2009  . CYSTOSCOPY WITH LITHOLAPAXY N/A 09/28/2016   Procedure: CYSTOSCOPY WITH LITHOLAPAXY;  Surgeon: Cleon Gustin, MD;  Location: [facility];  Service: Urology;  Laterality: N/A;  . CYSTOSCOPY WITH RETROGRADE PYELOGRAM, URETEROSCOPY AND STENT PLACEMENT Left 09/28/2016   Procedure: CYSTOSCOPY WITH RETROGRADE PYELOGRAM, URETEROSCOPY AND STENT PLACEMENT;  Surgeon: Cleon Gustin, MD;  Location: [facility];  Service: Urology;  Laterality: Left;  . CYSTOSCOPY/RETROGRADE/URETEROSCOPY/STONE EXTRACTION WITH BASKET  per pt few times prior to 2011  . EXTRACORPOREAL SHOCK WAVE LITHOTRIPSY Left 09/30/2009  . EXTRACORPOREAL SHOCK WAVE LITHOTRIPSY  per pt multiple times last 50 yrs  . HOLMIUM LASER APPLICATION Left 7/56/4332   Procedure: HOLMIUM LASER APPLICATION;  Surgeon: Cleon Gustin, MD;  Location: [facility];  Service: Urology;  Laterality: Left;  . LEFT URETEROSCOPY STONE EXTRACTION AND REMOVAL BLADDER STONE  05-10-2010  . PERCUTANEOUS NEPHROSTOLITHOTOMY  1965    Family History  Problem Relation Age of Onset  . Arthritis Mother   . Arthritis Father   .  Cancer Father     No Known Allergies  Current Outpatient Prescriptions on File Prior to Visit  Medication Sig Dispense Refill  . aspirin 81 MG tablet Take 81 mg by mouth daily.    . Multiple Vitamins-Minerals (CENTRUM SILVER 50+MEN) TABS Take by mouth daily.    . Multiple Vitamins-Minerals (PRESERVISION/LUTEIN PO) Take 1 tablet by mouth daily.    . ondansetron (ZOFRAN ODT) 4 MG disintegrating tablet Take 1 tablet (4 mg total) by mouth every 8 (eight) hours as needed for nausea or vomiting. 6 tablet 0  . ciprofloxacin (CIPRO) 500 MG tablet Take 1 tablet (500 mg total) by mouth 2 (two) times daily. (Patient not taking: Reported on 10/31/2016) 6 tablet 0  . tamsulosin (FLOMAX) 0.4 MG CAPS capsule  Take 1 capsule (0.4 mg total) by mouth daily. (Patient not taking: Reported on 10/31/2016) 30 capsule 0   No current facility-administered medications on file prior to visit.     BP 130/76 (BP Location: Left Arm, Patient Position: Sitting, Cuff Size: Normal)   Pulse 81   Temp 98.5 F (36.9 C) (Oral)   Resp 14   Ht 5\' 10"  (1.778 m)   Wt 162 lb (73.5 kg)   SpO2 98%   BMI 23.24 kg/m     Review of Systems  Constitutional: Positive for fatigue. Negative for appetite change, chills and fever.  HENT: Negative for congestion, dental problem, ear pain, hearing loss, sore throat, tinnitus, trouble swallowing and voice change.   Eyes: Negative for pain, discharge and visual disturbance.  Respiratory: Negative for cough, chest tightness, wheezing and stridor.   Cardiovascular: Negative for chest pain, palpitations and leg swelling.  Gastrointestinal: Negative for abdominal distention, abdominal pain, blood in stool, constipation, diarrhea, nausea and vomiting.  Genitourinary: Negative for difficulty urinating, discharge, flank pain, genital sores, hematuria and urgency.  Musculoskeletal: Negative for arthralgias, back pain, gait problem, joint swelling, myalgias and neck stiffness.  Skin: Negative for rash.  Neurological: Positive for weakness. Negative for dizziness, syncope, speech difficulty, numbness and headaches.  Hematological: Negative for adenopathy. Does not bruise/bleed easily.  Psychiatric/Behavioral: Negative for behavioral problems and dysphoric mood. The patient is not nervous/anxious.        Objective:   Physical Exam  Constitutional: He is oriented to person, place, and time. He appears well-developed. No distress.  Blood pressure low normal  HENT:  Head: Normocephalic.  Right Ear: External ear normal.  Left Ear: External ear normal.  Eyes: Conjunctivae and EOM are normal.  Neck: Normal range of motion.  Cardiovascular: Normal rate and normal heart sounds.     Pulmonary/Chest: Breath sounds normal.  Abdominal: Bowel sounds are normal.  Musculoskeletal: Normal range of motion. He exhibits no edema or tenderness.  Neurological: He is alert and oriented to person, place, and time.  Psychiatric: He has a normal mood and affect. His behavior is normal.          Assessment & Plan:   History weight loss.  Improved Status post left-sided hydronephrosis.  Status post stenting History of pseudomonal UTI Coronary artery calcification Aortic atherosclerosis.  Statin therapy discussed.  Will start low intensity.  Atorvastatin continued daily low-dose aspirin  Follow-up 3 months  Caileigh Canche Pilar Plate

## 2016-10-31 NOTE — Patient Instructions (Signed)
It is important that you exercise regularly, at least 20 minutes 3 to 4 times per week.  If you develop chest pain or shortness of breath seek  medical attention.  Return in 3 months for follow-up  Call or return to clinic prn if these symptoms worsen or fail to improve as anticipated.

## 2016-11-17 ENCOUNTER — Ambulatory Visit (INDEPENDENT_AMBULATORY_CARE_PROVIDER_SITE_OTHER): Payer: 59 | Admitting: Internal Medicine

## 2016-11-17 ENCOUNTER — Encounter: Payer: Self-pay | Admitting: Internal Medicine

## 2016-11-17 VITALS — BP 132/72 | HR 81 | Temp 98.5°F | Ht 70.0 in | Wt 165.0 lb

## 2016-11-17 DIAGNOSIS — I2510 Atherosclerotic heart disease of native coronary artery without angina pectoris: Secondary | ICD-10-CM

## 2016-11-17 DIAGNOSIS — I2584 Coronary atherosclerosis due to calcified coronary lesion: Secondary | ICD-10-CM | POA: Diagnosis not present

## 2016-11-17 NOTE — Progress Notes (Signed)
Subjective:    Patient ID: [name]., male    DOB: [birth year], 83 y.o.   MRN: [id number]  HPI  Wt Readings from Last 3 Encounters:  11/17/16 165 lb (74.8 kg)  10/31/16 162 lb (73.5 kg)  10/17/16 154 lb 9.6 oz (38.72 kg)   83 year old patient who has established recently and has been followed for weight loss. This has resolved.  In general feels quite well.  2 days ago he became constipated and yesterday took Dulcolax and has had a number of bowel movements.  He was concerned about constipation as a sign of possible colon cancer. Last month.  Rectal exam did reveal hematest negative stool.  There is been no recent anemia, although he does have a remote history of iron deficiency anemia.  In general, he feels well with good appetite and continues to gain weight.  He is basically back to his maintenance weight  Past Medical History:  Diagnosis Date  . Anemia   . Arthritis    right hand  . Bilateral renal cysts   . Bladder stone   . BPH with elevated PSA   . Cholelithiasis    per CT 09-21-2016  . History of fracture of rib    per pt fractured ribs, multiple, from teen, collage years , in [employer]   . History of iron deficiency anemia   . History of kidney stones   . History of pulmonary embolus (PE)    November 2000 --  completed 5 yrs anticoagulation, now ASA 81mg  since-- no recurrence  . History of skin cancer    per pt removal several times from scalp, unsure BCC or SCC  . Left ureteral stone   . Lower urinary tract symptoms (LUTS)   . Nephrolithiasis    bilateral non-obstructive per CT 09-21-2016  . Prostate carcinoma [facility]) urologist-  dr Alyson Ingles (previously Dr Janice Norrie)   dx Mar 2011-- Gleason 3+3 , PSA 7.65 --- active survillence --- last PSA Aug 2016,  6.95  . Right ureteral stone   . Wears glasses   . Wears hearing aid in both ears      Social History   Social History  . Marital status: Married    Spouse name: N/A  . Number of children: N/A  . Years  of education: N/A   Occupational History  . Not on file.   Social History Main Topics  . Smoking status: Current Every Day Smoker    Years: 60.00    Types: Pipe  . Smokeless tobacco: Never Used  . Alcohol use No  . Drug use: No  . Sexual activity: Not on file   Other Topics Concern  . Not on file   Social History Narrative  . No narrative on file    Past Surgical History:  Procedure Laterality Date  . APPENDECTOMY    . CYSTO/ LEFT RETROGRADE PYELOGRAM/ LEFT STENT PLACEMENT  09-27-2009  . CYSTOSCOPY WITH LITHOLAPAXY N/A 09/28/2016   Procedure: CYSTOSCOPY WITH LITHOLAPAXY;  Surgeon: Cleon Gustin, MD;  Location: [facility];  Service: Urology;  Laterality: N/A;  . CYSTOSCOPY WITH RETROGRADE PYELOGRAM, URETEROSCOPY AND STENT PLACEMENT Left 09/28/2016   Procedure: CYSTOSCOPY WITH RETROGRADE PYELOGRAM, URETEROSCOPY AND STENT PLACEMENT;  Surgeon: Cleon Gustin, MD;  Location: [facility];  Service: Urology;  Laterality: Left;  . CYSTOSCOPY/RETROGRADE/URETEROSCOPY/STONE EXTRACTION WITH BASKET  per pt few times prior to 2011  . EXTRACORPOREAL SHOCK WAVE LITHOTRIPSY Left 09/30/2009  . EXTRACORPOREAL SHOCK  WAVE LITHOTRIPSY  per pt multiple times last 50 yrs  . HOLMIUM LASER APPLICATION Left 1/61/0960   Procedure: HOLMIUM LASER APPLICATION;  Surgeon: Cleon Gustin, MD;  Location: [facility];  Service: Urology;  Laterality: Left;  . LEFT URETEROSCOPY STONE EXTRACTION AND REMOVAL BLADDER STONE  05-10-2010  . PERCUTANEOUS NEPHROSTOLITHOTOMY  1965    Family History  Problem Relation Age of Onset  . Arthritis Mother   . Arthritis Father   . Cancer Father     No Known Allergies  Current Outpatient Prescriptions on File Prior to Visit  Medication Sig Dispense Refill  . aspirin 81 MG tablet Take 81 mg by mouth daily.    Marland Kitchen atorvastatin (LIPITOR) 10 MG tablet Take 1 tablet (10 mg total) by mouth daily. 90 tablet 3  . Multiple  Vitamins-Minerals (CENTRUM SILVER 50+MEN) TABS Take by mouth daily.    . Multiple Vitamins-Minerals (PRESERVISION/LUTEIN PO) Take 1 tablet by mouth daily.    . ondansetron (ZOFRAN ODT) 4 MG disintegrating tablet Take 1 tablet (4 mg total) by mouth every 8 (eight) hours as needed for nausea or vomiting. 6 tablet 0  . tamsulosin (FLOMAX) 0.4 MG CAPS capsule Take 1 capsule (0.4 mg total) by mouth daily. 30 capsule 0   No current facility-administered medications on file prior to visit.     BP 132/72 (BP Location: Left Arm, Patient Position: Sitting, Cuff Size: Normal)   Pulse 81   Temp 98.5 F (36.9 C) (Oral)   Ht 5\' 10"  (1.778 m)   Wt 165 lb (74.8 kg)   SpO2 97%   BMI 23.68 kg/m     Review of Systems  Constitutional: Negative for appetite change, chills, fatigue and fever.  HENT: Negative for congestion, dental problem, ear pain, hearing loss, sore throat, tinnitus, trouble swallowing and voice change.   Eyes: Negative for pain, discharge and visual disturbance.  Respiratory: Negative for cough, chest tightness, wheezing and stridor.   Cardiovascular: Negative for chest pain, palpitations and leg swelling.  Gastrointestinal: Positive for constipation. Negative for abdominal distention, abdominal pain, blood in stool, diarrhea, nausea and vomiting.  Genitourinary: Negative for difficulty urinating, discharge, flank pain, genital sores, hematuria and urgency.  Musculoskeletal: Negative for arthralgias, back pain, gait problem, joint swelling, myalgias and neck stiffness.  Skin: Negative for rash.  Neurological: Negative for dizziness, syncope, speech difficulty, weakness, numbness and headaches.  Hematological: Negative for adenopathy. Does not bruise/bleed easily.  Psychiatric/Behavioral: Negative for behavioral problems and dysphoric mood. The patient is not nervous/anxious.        Objective:   Physical Exam  Constitutional: He is oriented to person, place, and time. He appears  well-developed and well-nourished.  HENT:  Head: Normocephalic.  Right Ear: External ear normal.  Left Ear: External ear normal.  Eyes: Conjunctivae and EOM are normal.  Neck: Normal range of motion.  Cardiovascular: Normal rate and normal heart sounds.   Pulmonary/Chest: Breath sounds normal.  Abdominal: Soft. Bowel sounds are normal. He exhibits no distension. There is no tenderness. There is no rebound.  Prominent aortic pulsation  Musculoskeletal: Normal range of motion. He exhibits no edema or tenderness.  Neurological: He is alert and oriented to person, place, and time.  Psychiatric: He has a normal mood and affect. His behavior is normal.          Assessment & Plan:   Constipation.  Stool has been hematest negative and patient has no anemia.  Will check stool for occult blood times 4.  Constipation.  Issues addressed History weight loss, now stable  Follow-up 6 months as scheduled  Nyoka Cowden

## 2016-11-17 NOTE — Patient Instructions (Addendum)

## 2017-06-02 IMAGING — US US RENAL
1 series · 14 of 25 positions shown · non-contrast
Comparison: Ultrasound 05/16/2012

CLINICAL DATA: Left flank pain radiating to the groin, 1 day
duration. History of kidney stones.

EXAM:
RENAL / URINARY TRACT ULTRASOUND COMPLETE

[Series 1: us renal · 0.22mm/px · 14 of 48 slices shown]
[im 1/48]
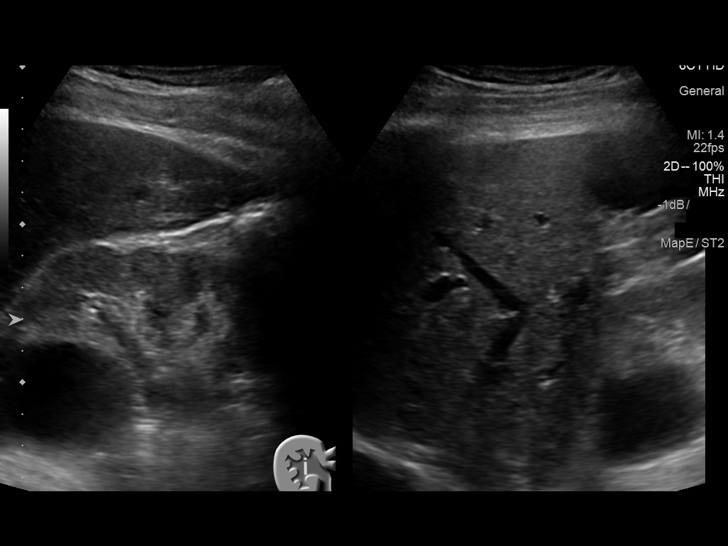
[im 4/48]
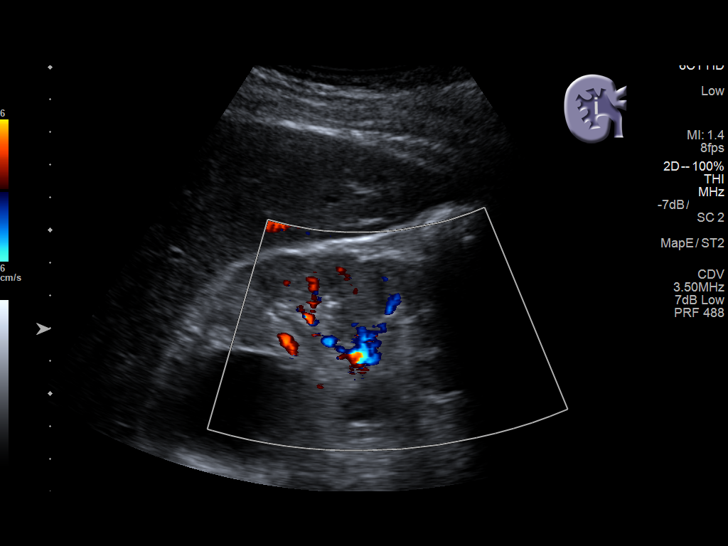
[im 8/48]
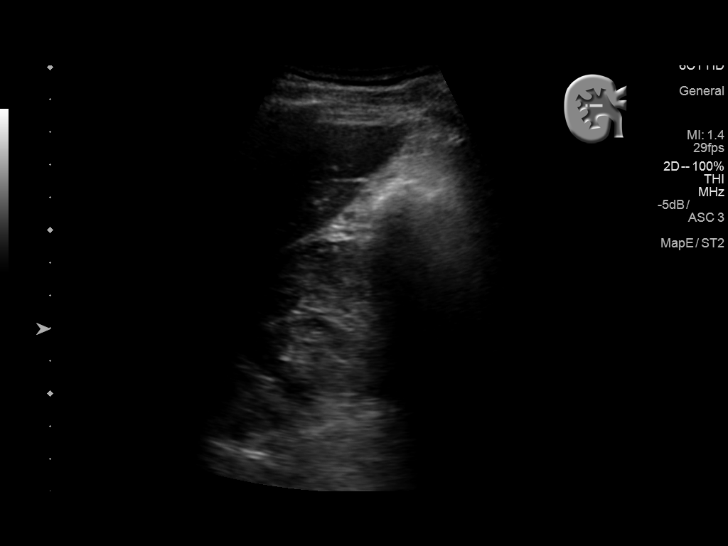
[im 12/48]
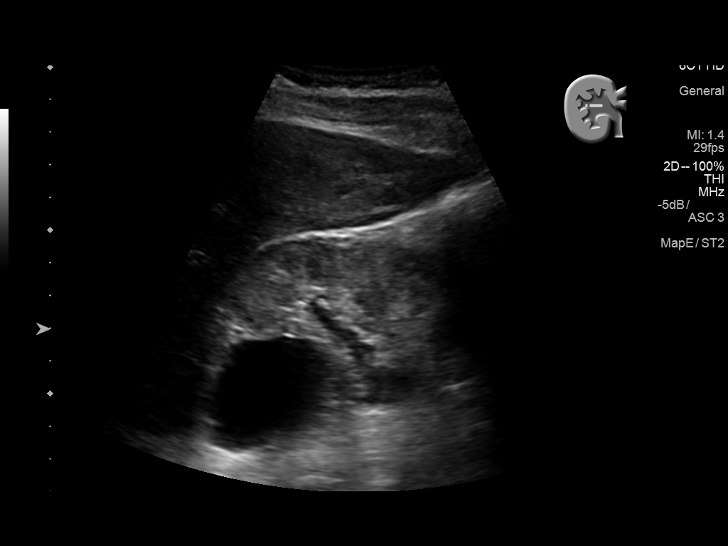
[im 16/48]
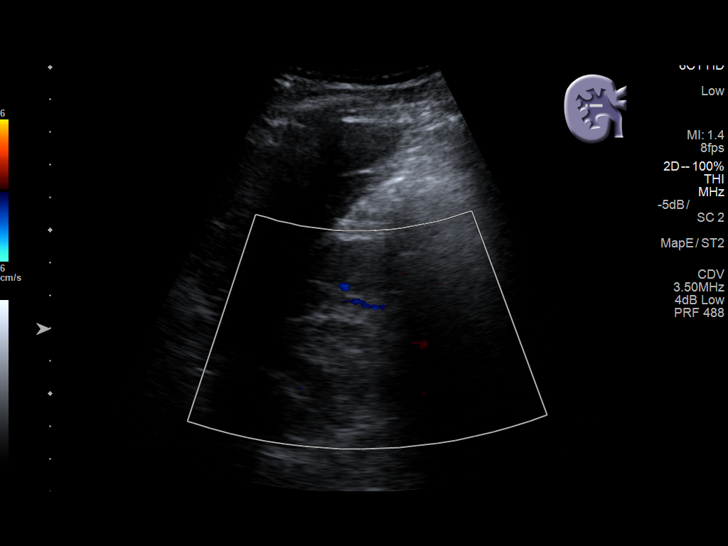
[im 18/48]
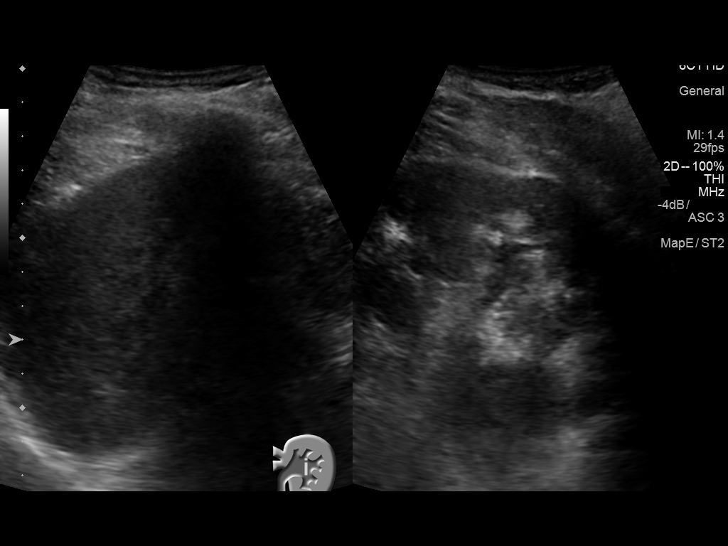
[im 22/48]
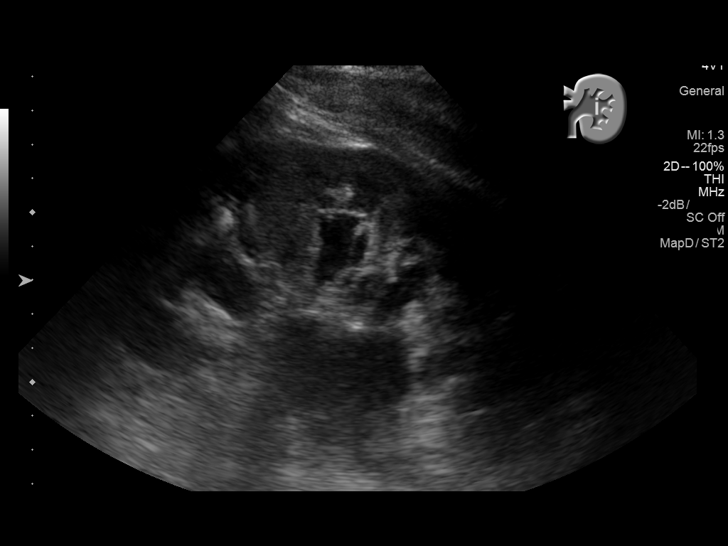
[im 26/48]
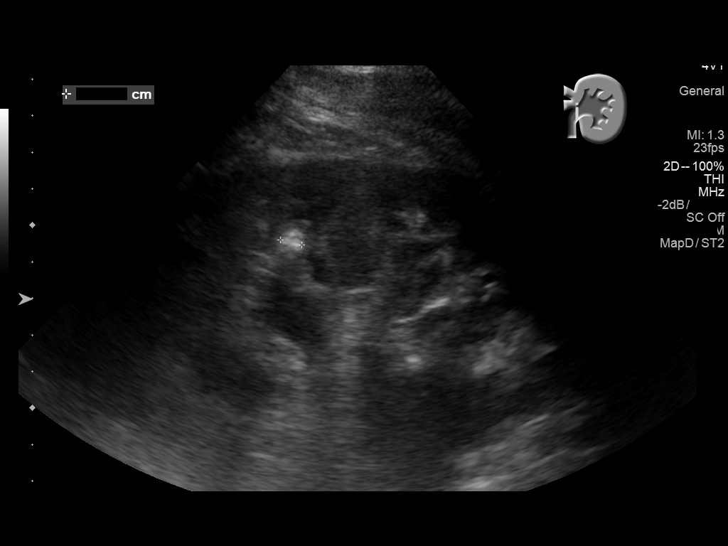
[im 30/48]
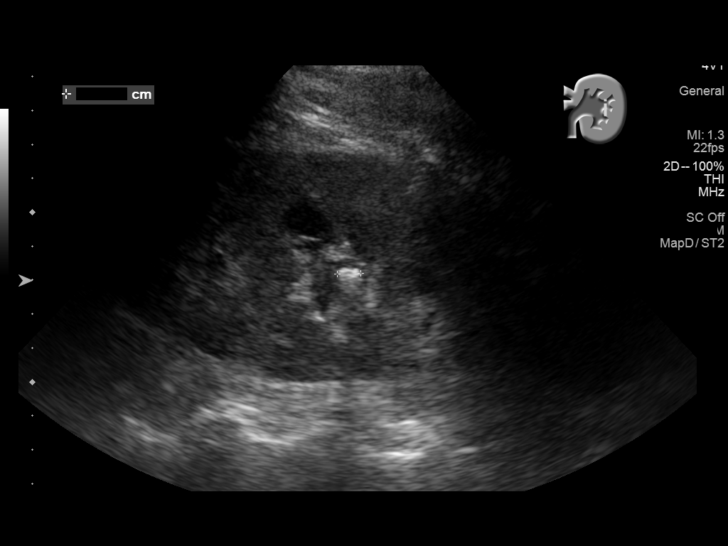
[im 32/48]
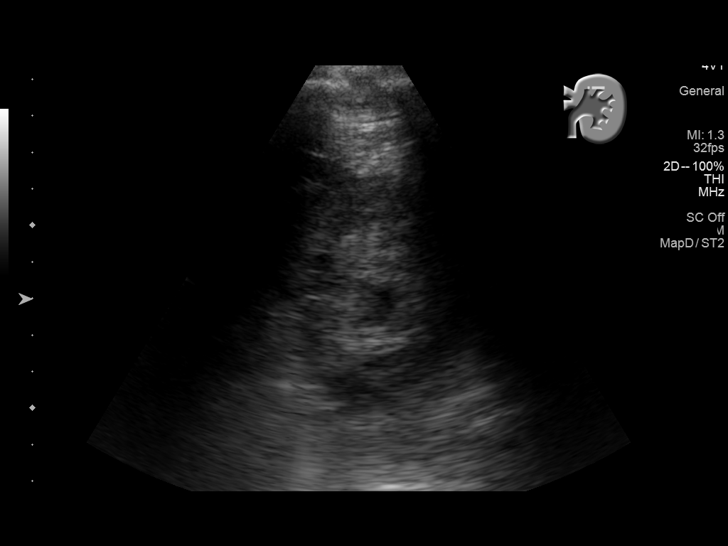
[im 36/48]
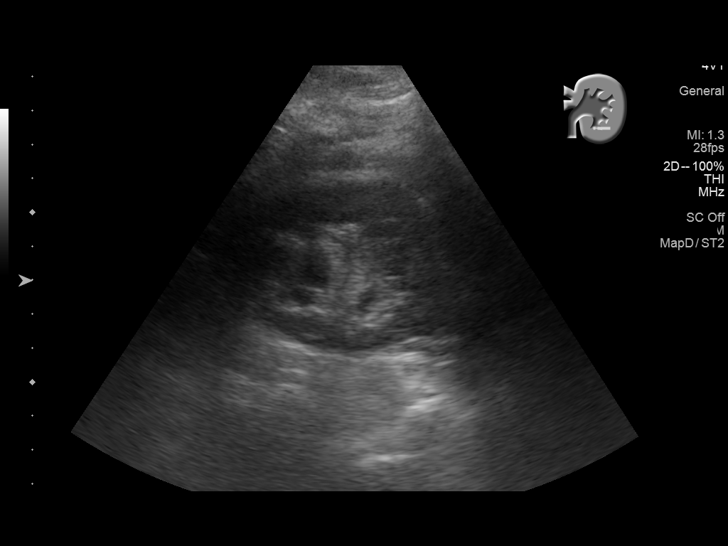
[im 40/48]
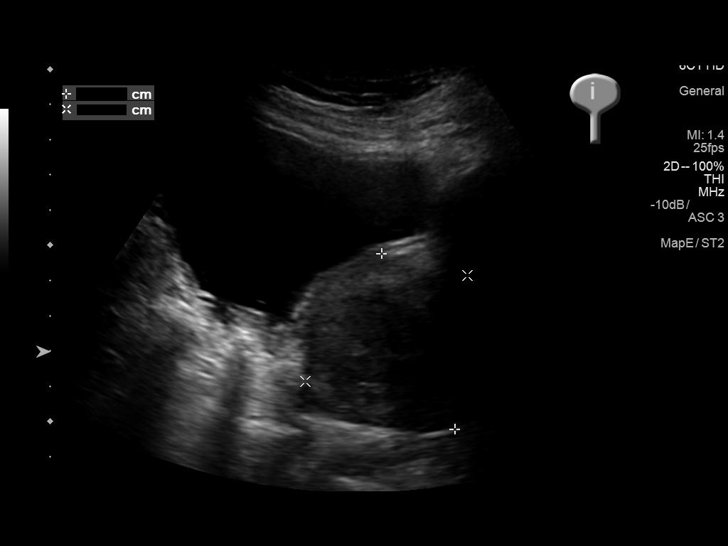
[im 44/48]
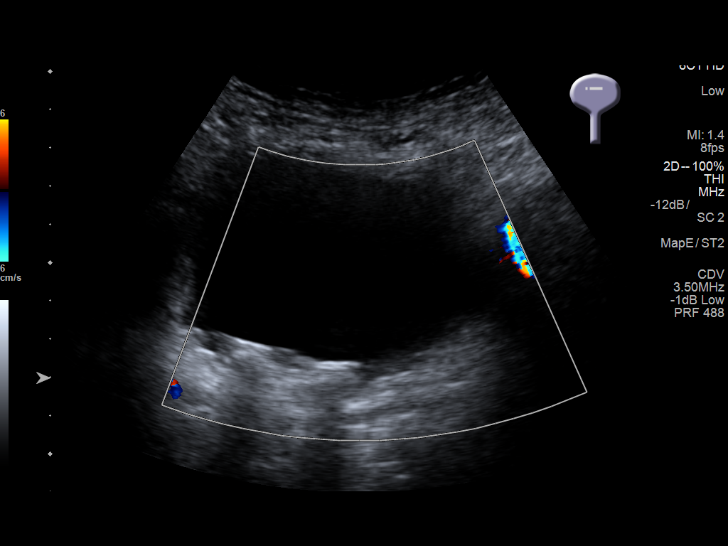
[im 48/48]
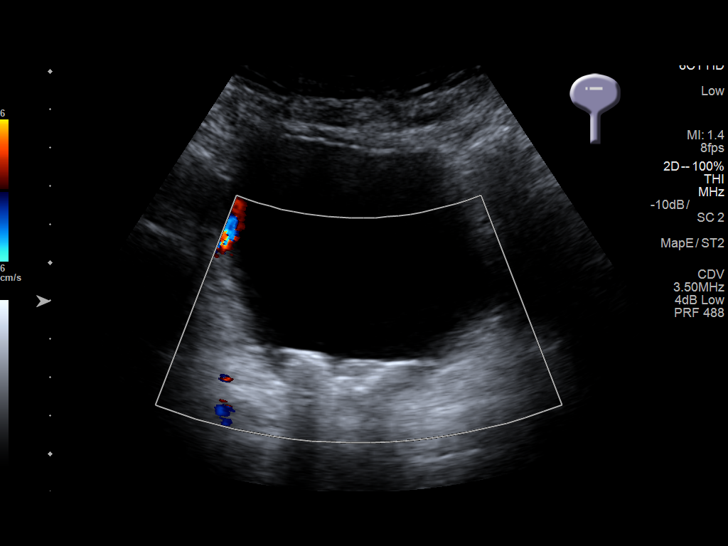

[14 of 25 positions shown; findings below may reference images not displayed]

FINDINGS: Right Kidney:

Length: 8.5 cm. Increased cortical echogenicity. Multiple benign
appearing cysts, largest measuring 3.8 x 3.6 x 4.1 cm. No evidence
of stone or hydronephrosis.

Left Kidney:

Length: 13.2 cm. Dilatation of the renal collecting system suggests
the presence of possible ureteral obstruction. Echogenic foci in the
left kidney consistent with multiple renal calculi..

Bladder:

No ureteral jet seen on the left. Probable stone or stones in the
bladder. Enlarged prostate.
IMPRESSION: Right renal atrophy.

Multiple left renal calculi. Left hydronephrosis. No left ureteral
jet seen. Findings suggest passing stone on the left. Stones present
within the bladder.

## 2017-07-31 ENCOUNTER — Encounter: Payer: Self-pay | Admitting: Internal Medicine

## 2017-07-31 ENCOUNTER — Ambulatory Visit (INDEPENDENT_AMBULATORY_CARE_PROVIDER_SITE_OTHER): Payer: Medicare Other | Admitting: Internal Medicine

## 2017-07-31 VITALS — BP 118/82 | HR 79 | Temp 98.5°F | Resp 16 | Wt 165.0 lb

## 2017-07-31 DIAGNOSIS — M25519 Pain in unspecified shoulder: Secondary | ICD-10-CM

## 2017-07-31 MED ORDER — METHYLPREDNISOLONE ACETATE 40 MG/ML IJ SUSP
40.0000 mg | Freq: Once | INTRAMUSCULAR | Status: AC
  Administered 2017-07-31: 40 mg via INTRAMUSCULAR

## 2017-07-31 NOTE — Progress Notes (Signed)
Subjective:    Patient ID: Joshua Bollman., male    DOB: December 16, 1929, 84 y.o.   MRN: [id number]  HPI  84 year old patient who fell 4 days ago.  He awoke at 5 AM, and became quite dizzy while ambulating to the bathroom.  He fell landing on his left shoulder which he has a dislocated in the past. He did well throughout the day Friday but the following day began having left shoulder pain.  This also has improved but presently he is having right shoulder and especially neck discomfort aggravated by movement.  No radicular symptoms.  He has had no recurrent dizziness or falls  Past Medical History:  Diagnosis Date  . Anemia   . Arthritis    right hand  . Bilateral renal cysts   . Bladder stone   . BPH with elevated PSA   . Cholelithiasis    per CT 09-21-2016  . History of fracture of rib    per pt fractured ribs, multiple, from teen, collage years , in [employer]   . History of iron deficiency anemia   . History of kidney stones   . History of pulmonary embolus (PE)    November 2000 --  completed 5 yrs anticoagulation, now ASA 81mg  since-- no recurrence  . History of skin cancer    per pt removal several times from scalp, unsure BCC or SCC  . Left ureteral stone   . Lower urinary tract symptoms (LUTS)   . Nephrolithiasis    bilateral non-obstructive per CT 09-21-2016  . Prostate carcinoma [facility]) urologist-  dr Alyson Ingles (previously Dr Janice Norrie)   dx Mar 2011-- Gleason 3+3 , PSA 7.65 --- active survillence --- last PSA Aug 2016,  6.95  . Right ureteral stone   . Wears glasses   . Wears hearing aid in both ears      Social History   Socioeconomic History  . Marital status: Married    Spouse name: Not on file  . Number of children: Not on file  . Years of education: Not on file  . Highest education level: Not on file  Occupational History  . Not on file  Social Needs  . Financial resource strain: Not on file  . Food insecurity:    Worry: Not on file    Inability: Not on  file  . Transportation needs:    Medical: Not on file    Non-medical: Not on file  Tobacco Use  . Smoking status: Current Every Day Smoker    Years: 60.00    Types: Pipe  . Smokeless tobacco: Never Used  Substance and Sexual Activity  . Alcohol use: No  . Drug use: No  . Sexual activity: Not on file  Lifestyle  . Physical activity:    Days per week: Not on file    Minutes per session: Not on file  . Stress: Not on file  Relationships  . Social connections:    Talks on phone: Not on file    Gets together: Not on file    Attends religious service: Not on file    Active member of club or organization: Not on file    Attends meetings of clubs or organizations: Not on file    Relationship status: Not on file  . Intimate partner violence:    Fear of current or ex partner: Not on file    Emotionally abused: Not on file    Physically abused: Not on file  Forced sexual activity: Not on file  Other Topics Concern  . Not on file  Social History Narrative  . Not on file    Past Surgical History:  Procedure Laterality Date  . APPENDECTOMY    . CYSTO/ LEFT RETROGRADE PYELOGRAM/ LEFT STENT PLACEMENT  09-27-2009  . CYSTOSCOPY WITH LITHOLAPAXY N/A 09/28/2016   Procedure: CYSTOSCOPY WITH LITHOLAPAXY;  Surgeon: Cleon Gustin, MD;  Location: [facility];  Service: Urology;  Laterality: N/A;  . CYSTOSCOPY WITH RETROGRADE PYELOGRAM, URETEROSCOPY AND STENT PLACEMENT Left 09/28/2016   Procedure: CYSTOSCOPY WITH RETROGRADE PYELOGRAM, URETEROSCOPY AND STENT PLACEMENT;  Surgeon: Cleon Gustin, MD;  Location: [facility];  Service: Urology;  Laterality: Left;  . CYSTOSCOPY/RETROGRADE/URETEROSCOPY/STONE EXTRACTION WITH BASKET  per pt few times prior to 2011  . EXTRACORPOREAL SHOCK WAVE LITHOTRIPSY Left 09/30/2009  . EXTRACORPOREAL SHOCK WAVE LITHOTRIPSY  per pt multiple times last 50 yrs  . HOLMIUM LASER APPLICATION Left 10/11/9483   Procedure: HOLMIUM  LASER APPLICATION;  Surgeon: Cleon Gustin, MD;  Location: [facility];  Service: Urology;  Laterality: Left;  . LEFT URETEROSCOPY STONE EXTRACTION AND REMOVAL BLADDER STONE  05-10-2010  . PERCUTANEOUS NEPHROSTOLITHOTOMY  1965    Family History  Problem Relation Age of Onset  . Arthritis Mother   . Arthritis Father   . Cancer Father     No Known Allergies  Current Outpatient Medications on File Prior to Visit  Medication Sig Dispense Refill  . aspirin 81 MG tablet Take 81 mg by mouth daily.    Marland Kitchen atorvastatin (LIPITOR) 10 MG tablet Take 1 tablet (10 mg total) by mouth daily. 90 tablet 3  . Multiple Vitamins-Minerals (CENTRUM SILVER 50+MEN) TABS Take by mouth daily.    . Multiple Vitamins-Minerals (PRESERVISION/LUTEIN PO) Take 1 tablet by mouth daily.    . tamsulosin (FLOMAX) 0.4 MG CAPS capsule Take 1 capsule (0.4 mg total) by mouth daily. 30 capsule 0   No current facility-administered medications on file prior to visit.     BP 118/82 (BP Location: Left Arm, Patient Position: Sitting, Cuff Size: Normal)   Pulse 79   Temp 98.5 F (36.9 C) (Oral)   Resp 16   Wt 165 lb (74.8 kg)   SpO2 95%   BMI 23.68 kg/m     Review of Systems  Constitutional: Negative for appetite change, chills, fatigue and fever.  HENT: Negative for congestion, dental problem, ear pain, hearing loss, sore throat, tinnitus, trouble swallowing and voice change.   Eyes: Negative for pain, discharge and visual disturbance.  Respiratory: Negative for cough, chest tightness, wheezing and stridor.   Cardiovascular: Negative for chest pain, palpitations and leg swelling.  Gastrointestinal: Negative for abdominal distention, abdominal pain, blood in stool, constipation, diarrhea, nausea and vomiting.  Genitourinary: Negative for difficulty urinating, discharge, flank pain, genital sores, hematuria and urgency.  Musculoskeletal: Positive for arthralgias, neck pain and neck stiffness. Negative  for back pain, gait problem, joint swelling and myalgias.  Skin: Negative for rash.  Neurological: Negative for dizziness, syncope, speech difficulty, weakness, numbness and headaches.  Hematological: Negative for adenopathy. Does not bruise/bleed easily.  Psychiatric/Behavioral: Negative for behavioral problems and dysphoric mood. The patient is not nervous/anxious.        Objective:   Physical Exam  Constitutional: He is oriented to person, place, and time. He appears well-developed.  HENT:  Head: Normocephalic.  Right Ear: External ear normal.  Left Ear: External ear normal.  Eyes: Conjunctivae and EOM are  normal.  Neck:  Decreased range of motion of the neck especially with lateral movement and neck extension Tenderness over the posterior neck musculature Upper back musculature especially the right trapezius tight tense and sore to touch Only minimal decreased range of motion of both shoulders  Cardiovascular: Normal rate and normal heart sounds.  Pulmonary/Chest: Breath sounds normal.  Abdominal: Bowel sounds are normal.  Musculoskeletal: He exhibits no edema or tenderness.  Neurological: He is alert and oriented to person, place, and time.  Psychiatric: He has a normal mood and affect. His behavior is normal.          Assessment & Plan:   Cervical strain.  Will treat with Depo-Medrol 40 mg daily he will consider a soft cervical collar.  Local heat therapy discussed and encouraged; he will return if unimproved   Nyoka Cowden

## 2017-07-31 NOTE — Patient Instructions (Addendum)
You  may move around, but avoid painful motions and activities.  Apply heat to the sore area for 15 to 20 minutes 3 or 4 times daily for the next two to 3 days.  Use a soft cervical collar  Take Aleve 200 mg twice daily for pain    Cervical Collar A cervical collar is a device that supports your chin and the back of your head. It is used after a severe neck injury to protect your head and neck. It does this by restricting the movement of the top part of your spine, which is located in your neck. A cervical collar may be used when you have:  A fractured neck.  Ligament damage.  A spinal cord injury.  What instructions should I follow?  Wear the collar for as long as your health care provider instructs.  Follow your health care provider's instructions about how to put on and take off your collar.  Do not make your collar so tight that you feel pain or it is hard for you to breathe.  Do not remove the collar unless your health care provider says it is okay. Ask your health care provider if you can remove the collar for showering or eating or to apply ice.  Do not drive a car until your health care provider says it is okay.  Keep all follow-up visits as directed by your health care provider. This is important. Any delay in getting necessary care can keep your injury from healing properly.  Apply ice to the injured area: ? Put ice in a plastic bag. ? Place a towel between your skin and the bag. ? Leave the ice on for 20 minutes, 2-3 times per day for the first 2 days. This information is not intended to replace advice given to you by your health care provider. Make sure you discuss any questions you have with your health care provider. Document Released: 12/25/2003 Document Revised: 08/12/2015 Document Reviewed: 11/10/2013 Elsevier Interactive Patient Education  2018 [employer].  Cervical Sprain A cervical sprain is a stretch or tear in the tissues that connect bones (ligaments)  in the neck. Most neck (cervical) sprains get better in 4-6 weeks. Follow these instructions at home: If you have a neck collar:  Wear it as told by your doctor. Do not take off (do not remove) the collar unless your doctor says that this is safe.  Ask your doctor before adjusting your collar.  If you have long hair, keep it outside of the collar.  Ask your doctor if you may take off the collar for cleaning and bathing. If you may take off the collar: ? Follow instructions from your doctor about how to take off the collar safely. ? Clean the collar by wiping it with mild soap and water. Let it air-dry all the way. ? If your collar has removable pads:  Take the pads out every 1-2 days.  Hand wash the pads with soap and water.  Let the pads air-dry all the way before you put them back in the collar. Do not dry them in a clothes dryer. Do not dry them with a hair dryer. ? Check your skin under the collar for irritation or sores. If you see any, tell your doctor. Managing pain, stiffness, and swelling  Use a cervical traction device, if told by your doctor.  If told, put heat on the affected area. Do this before exercises (physical therapy) or as often as told by your doctor.  Use the heat source that your doctor recommends, such as a moist heat pack or a heating pad. ? Place a towel between your skin and the heat source. ? Leave the heat on for 20-30 minutes. ? Take the heat off (remove the heat) if your skin turns bright red. This is very important if you cannot feel pain, heat, or cold. You may have a greater risk of getting burned.  Put ice on the affected area. ? Put ice in a plastic bag. ? Place a towel between your skin and the bag. ? Leave the ice on for 20 minutes, 2-3 times a day. Activity  Do not drive while wearing a neck collar. If you do not have a neck collar, ask your doctor if it is safe to drive.  Do not drive or use heavy machinery while taking prescription pain  medicine or muscle relaxants, unless your doctor approves.  Do not lift anything that is heavier than 10 lb (4.5 kg) until your doctor tells you that it is safe.  Rest as told by your doctor.  Avoid activities that make you feel worse. Ask your doctor what activities are safe for you.  Do exercises as told by your doctor or physical therapist. Preventing neck sprain  Practice good posture. Adjust your workstation to help with this, if needed.  Exercise regularly as told by your doctor or physical therapist.  Avoid activities that are risky or may cause a neck sprain (cervical sprain). General instructions  Take over-the-counter and prescription medicines only as told by your doctor.  Do not use any products that contain nicotine or tobacco. This includes cigarettes and e-cigarettes. If you need help quitting, ask your doctor.  Keep all follow-up visits as told by your doctor. This is important. Contact a doctor if:  You have pain or other symptoms that get worse.  You have symptoms that do not get better after 2 weeks.  You have pain that does not get better with medicine.  You start to have new, unexplained symptoms.  You have sores or irritated skin from wearing your neck collar. Get help right away if:  You have very bad pain.  You have any of the following in any part of your body: ? Loss of feeling (numbness). ? Tingling. ? Weakness.  You cannot move a part of your body (you have paralysis).  Your activity level does not improve. Summary  A cervical sprain is a stretch or tear in the tissues that connect bones (ligaments) in the neck.  If you have a neck (cervical) collar, do not take off the collar unless your doctor says that this is safe.  Put ice on affected areas as told by your doctor.  Put heat on affected areas as told by your doctor.  Good posture and regular exercise can help prevent a neck sprain from happening again. This information is not  intended to replace advice given to you by your health care provider. Make sure you discuss any questions you have with your health care provider. Document Released: 09/20/2007 Document Revised: 12/14/2015 Document Reviewed: 12/14/2015 Elsevier Interactive Patient Education  2017 [employer].

## 2017-08-13 ENCOUNTER — Ambulatory Visit: Payer: Self-pay

## 2017-08-13 NOTE — Telephone Encounter (Signed)
Pt. C/o periods of weakness and dizziness "for about one week." Reports no other symptoms. No weakness on one side of his body. Reports "I have weakness in both my knees." Denies shortness of breath or chest pain. Reports he is eating and drinking well. States " I want to sure I didn't have a stroke." Informed pt. If he thinks he has had a stroke to go to ED immediately. Requests appointment with Dr. Burnice Logan. "I want his opinion." Appointment made for tomorrow. Instructed if he feels worse to go to ED. Verbalizes understanding.  Reason for Disposition . [1] MODERATE dizziness (e.g., interferes with normal activities) AND [2] has NOT been evaluated by physician for this  (Exception: dizziness caused by heat exposure, sudden standing, or poor fluid intake)  Answer Assessment - Initial Assessment Questions 1. DESCRIPTION: "Describe your dizziness."     Lightheaded 2. LIGHTHEADED: "Do you feel lightheaded?" (e.g., somewhat faint, woozy, weak upon standing)      Feels weak 3. VERTIGO: "Do you feel like either you or the room is spinning or tilting?" (i.e. vertigo)     No 4. SEVERITY: "How bad is it?"  "Do you feel like you are going to faint?" "Can you stand and walk?"   - MILD - walking normally   - MODERATE - interferes with normal activities (e.g., work, school)    - SEVERE - unable to stand, requires support to walk, feels like passing out now.      Moderate 5. ONSET:  "When did the dizziness begin?"     1week 6. AGGRAVATING FACTORS: "Does anything make it worse?" (e.g., standing, change in head position)     Walking 7. HEART RATE: "Can you tell me your heart rate?" "How many beats in 15 seconds?"  (Note: not all patients can do this)       No 8. CAUSE: "What do you think is causing the dizziness?"     Unsure 9. RECURRENT SYMPTOM: "Have you had dizziness before?" If so, ask: "When was the last time?" "What happened that time?"     Yes 10. OTHER SYMPTOMS: "Do you have any other  symptoms?" (e.g., fever, chest pain, vomiting, diarrhea, bleeding)       Weakness both lower legs 11. PREGNANCY: "Is there any chance you are pregnant?" "When was your last menstrual period?"       No  Protocols used: DIZZINESS [facility]

## 2017-08-13 NOTE — Telephone Encounter (Signed)
Noted  

## 2017-08-14 ENCOUNTER — Encounter: Payer: Self-pay | Admitting: Internal Medicine

## 2017-08-14 ENCOUNTER — Ambulatory Visit (INDEPENDENT_AMBULATORY_CARE_PROVIDER_SITE_OTHER): Payer: Medicare Other | Admitting: Internal Medicine

## 2017-08-14 VITALS — BP 130/70 | HR 66 | Temp 98.5°F | Wt 162.0 lb

## 2017-08-14 DIAGNOSIS — M25512 Pain in left shoulder: Secondary | ICD-10-CM

## 2017-08-14 DIAGNOSIS — R634 Abnormal weight loss: Secondary | ICD-10-CM | POA: Diagnosis not present

## 2017-08-14 NOTE — Progress Notes (Signed)
Subjective:    Patient ID: [name]., male    DOB: Jul 23, 1929, 84 y.o.   MRN: [id number]  HPI  84 year old patient who is seen today in follow-up.  2 days ago he awoke Sunday morning with bruising involving his left upper anterior chest wall area.  No known trauma.  He was seen 2 weeks ago after falling a few days earlier with bilateral shoulder pain.  His right shoulder is now doing well but he is still having some left shoulder and left upper back discomfort. He has had some stiffness involving the neck.  He describes some modest weight loss since his last visit here and weight is down approximately 3 pounds  Past Medical History:  Diagnosis Date  . Anemia   . Arthritis    right hand  . Bilateral renal cysts   . Bladder stone   . BPH with elevated PSA   . Cholelithiasis    per CT 09-21-2016  . History of fracture of rib    per pt fractured ribs, multiple, from teen, collage years , in [employer]   . History of iron deficiency anemia   . History of kidney stones   . History of pulmonary embolus (PE)    November 2000 --  completed 5 yrs anticoagulation, now ASA 81mg  since-- no recurrence  . History of skin cancer    per pt removal several times from scalp, unsure BCC or SCC  . Left ureteral stone   . Lower urinary tract symptoms (LUTS)   . Nephrolithiasis    bilateral non-obstructive per CT 09-21-2016  . Prostate carcinoma [facility]) urologist-  dr Alyson Ingles (previously Dr Janice Norrie)   dx Mar 2011-- Gleason 3+3 , PSA 7.65 --- active survillence --- last PSA Aug 2016,  6.95  . Right ureteral stone   . Wears glasses   . Wears hearing aid in both ears      Social History   Socioeconomic History  . Marital status: Married    Spouse name: Not on file  . Number of children: Not on file  . Years of education: Not on file  . Highest education level: Not on file  Occupational History  . Not on file  Social Needs  . Financial resource strain: Not on file  . Food  insecurity:    Worry: Not on file    Inability: Not on file  . Transportation needs:    Medical: Not on file    Non-medical: Not on file  Tobacco Use  . Smoking status: Current Every Day Smoker    Years: 60.00    Types: Pipe  . Smokeless tobacco: Never Used  Substance and Sexual Activity  . Alcohol use: No  . Drug use: No  . Sexual activity: Not on file  Lifestyle  . Physical activity:    Days per week: Not on file    Minutes per session: Not on file  . Stress: Not on file  Relationships  . Social connections:    Talks on phone: Not on file    Gets together: Not on file    Attends religious service: Not on file    Active member of club or organization: Not on file    Attends meetings of clubs or organizations: Not on file    Relationship status: Not on file  . Intimate partner violence:    Fear of current or ex partner: Not on file    Emotionally abused: Not on file  Physically abused: Not on file    Forced sexual activity: Not on file  Other Topics Concern  . Not on file  Social History Narrative  . Not on file    Past Surgical History:  Procedure Laterality Date  . APPENDECTOMY    . CYSTO/ LEFT RETROGRADE PYELOGRAM/ LEFT STENT PLACEMENT  09-27-2009  . CYSTOSCOPY WITH LITHOLAPAXY N/A 09/28/2016   Procedure: CYSTOSCOPY WITH LITHOLAPAXY;  Surgeon: Cleon Gustin, MD;  Location: [facility];  Service: Urology;  Laterality: N/A;  . CYSTOSCOPY WITH RETROGRADE PYELOGRAM, URETEROSCOPY AND STENT PLACEMENT Left 09/28/2016   Procedure: CYSTOSCOPY WITH RETROGRADE PYELOGRAM, URETEROSCOPY AND STENT PLACEMENT;  Surgeon: Cleon Gustin, MD;  Location: [facility];  Service: Urology;  Laterality: Left;  . CYSTOSCOPY/RETROGRADE/URETEROSCOPY/STONE EXTRACTION WITH BASKET  per pt few times prior to 2011  . EXTRACORPOREAL SHOCK WAVE LITHOTRIPSY Left 09/30/2009  . EXTRACORPOREAL SHOCK WAVE LITHOTRIPSY  per pt multiple times last 50 yrs  . HOLMIUM  LASER APPLICATION Left 08/03/6220   Procedure: HOLMIUM LASER APPLICATION;  Surgeon: Cleon Gustin, MD;  Location: [facility];  Service: Urology;  Laterality: Left;  . LEFT URETEROSCOPY STONE EXTRACTION AND REMOVAL BLADDER STONE  05-10-2010  . PERCUTANEOUS NEPHROSTOLITHOTOMY  1965    Family History  Problem Relation Age of Onset  . Arthritis Mother   . Arthritis Father   . Cancer Father     No Known Allergies  Current Outpatient Medications on File Prior to Visit  Medication Sig Dispense Refill  . aspirin 81 MG tablet Take 81 mg by mouth daily.    Marland Kitchen atorvastatin (LIPITOR) 10 MG tablet Take 1 tablet (10 mg total) by mouth daily. 90 tablet 3  . Multiple Vitamins-Minerals (CENTRUM SILVER 50+MEN) TABS Take by mouth daily.    . Multiple Vitamins-Minerals (PRESERVISION/LUTEIN PO) Take 1 tablet by mouth daily.    . tamsulosin (FLOMAX) 0.4 MG CAPS capsule Take 1 capsule (0.4 mg total) by mouth daily. 30 capsule 0   No current facility-administered medications on file prior to visit.     BP 130/70 (BP Location: Right Arm, Patient Position: Sitting, Cuff Size: Large)   Pulse 66   Temp 98.5 F (36.9 C) (Oral)   Wt 162 lb (73.5 kg)   SpO2 97%   BMI 23.24 kg/m     Review of Systems  Constitutional: Positive for unexpected weight change. Negative for appetite change, chills, fatigue and fever.  HENT: Negative for congestion, dental problem, ear pain, hearing loss, sore throat, tinnitus, trouble swallowing and voice change.   Eyes: Negative for pain, discharge and visual disturbance.  Respiratory: Negative for cough, chest tightness, wheezing and stridor.   Cardiovascular: Negative for chest pain, palpitations and leg swelling.  Gastrointestinal: Negative for abdominal distention, abdominal pain, blood in stool, constipation, diarrhea, nausea and vomiting.  Genitourinary: Negative for difficulty urinating, discharge, flank pain, genital sores, hematuria and urgency.    Musculoskeletal: Positive for back pain, neck pain and neck stiffness. Negative for arthralgias, gait problem, joint swelling and myalgias.  Skin: Positive for rash.  Neurological: Negative for dizziness, syncope, speech difficulty, weakness, numbness and headaches.  Hematological: Negative for adenopathy. Does not bruise/bleed easily.  Psychiatric/Behavioral: Negative for behavioral problems and dysphoric mood. The patient is not nervous/anxious.        Objective:   Physical Exam  Constitutional: He is oriented to person, place, and time. He appears well-developed.  HENT:  Head: Normocephalic.  Right Ear: External ear normal.  Left  Ear: External ear normal.  Eyes: Conjunctivae and EOM are normal.  Neck: Normal range of motion.  Cardiovascular: Normal rate and normal heart sounds.  Pulmonary/Chest: Breath sounds normal.  Abdominal: Bowel sounds are normal.  Musculoskeletal: Normal range of motion. He exhibits no edema or tenderness.  Neck is slightly stiff with minimal decreased range of motion improved compared to 2 weeks ago Slight tenderness over the left trapezius muscle but again improved  Neurological: He is alert and oriented to person, place, and time.  Skin:  Resolving ecchymoses over the left upper anterior chest wall over the pectoralis muscle  Psychiatric: He has a normal mood and affect. His behavior is normal.          Assessment & Plan:   Persistent left shoulder and upper back pain after a fall.  Patient is a bit improved we will continue to observe Ecchymoses left anterior chest wall.  No history of trauma exam unremarkable Modest weight loss.  Will continue to improve.  We will schedule CPX for later this summer  Nyoka Cowden

## 2017-08-14 NOTE — Patient Instructions (Addendum)
Call or return to clinic prn if these symptoms worsen or fail to improve as anticipated.  Return as scheduled for your annual exam Cervical Strain and Sprain Rehab Ask your health care provider which exercises are safe for you. Do exercises exactly as told by your health care provider and adjust them as directed. It is normal to feel mild stretching, pulling, tightness, or discomfort as you do these exercises, but you should stop right away if you feel sudden pain or your pain gets worse.Do not begin these exercises until told by your health care provider. Stretching and range of motion exercises These exercises warm up your muscles and joints and improve the movement and flexibility of your neck. These exercises also help to relieve pain, numbness, and tingling. Exercise A: Cervical side bend  1. Using good posture, sit on a stable chair or stand up. 2. Without moving your shoulders, slowly tilt your left / right ear to your shoulder until you feel a stretch in your neck muscles. You should be looking straight ahead. 3. Hold for __________ seconds. 4. Repeat with the other side of your neck. Repeat __________ times. Complete this exercise __________ times a day. Exercise B: Cervical rotation  1. Using good posture, sit on a stable chair or stand up. 2. Slowly turn your head to the side as if you are looking over your left / right shoulder. ? Keep your eyes level with the ground. ? Stop when you feel a stretch along the side and the back of your neck. 3. Hold for __________ seconds. 4. Repeat this by turning to your other side. Repeat __________ times. Complete this exercise __________ times a day. Exercise C: Thoracic extension and pectoral stretch 1. Roll a towel or a small blanket so it is about 4 inches (10 cm) in diameter. 2. Lie down on your back on a firm surface. 3. Put the towel lengthwise, under your spine in the middle of your back. It should not be not under your shoulder  blades. The towel should line up with your spine from your middle back to your lower back. 4. Put your hands behind your head and let your elbows fall out to your sides. 5. Hold for __________ seconds. Repeat __________ times. Complete this exercise __________ times a day. Strengthening exercises These exercises build strength and endurance in your neck. Endurance is the ability to use your muscles for a long time, even after your muscles get tired. Exercise D: Upper cervical flexion, isometric 1. Lie on your back with a thin pillow behind your head and a small rolled-up towel under your neck. 2. Gently tuck your chin toward your chest and nod your head down to look toward your feet. Do not lift your head off the pillow. 3. Hold for __________ seconds. 4. Release the tension slowly. Relax your neck muscles completely before you repeat this exercise. Repeat __________ times. Complete this exercise __________ times a day. Exercise E: Cervical extension, isometric  1. Stand about 6 inches (15 cm) away from a wall, with your back facing the wall. 2. Place a soft object, about 6-8 inches (15-20 cm) in diameter, between the back of your head and the wall. A soft object could be a small pillow, a ball, or a folded towel. 3. Gently tilt your head back and press into the soft object. Keep your jaw and forehead relaxed. 4. Hold for __________ seconds. 5. Release the tension slowly. Relax your neck muscles completely before you repeat this exercise.  Repeat __________ times. Complete this exercise __________ times a day. Posture and body mechanics  Body mechanics refers to the movements and positions of your body while you do your daily activities. Posture is part of body mechanics. Good posture and healthy body mechanics can help to relieve stress in your body's tissues and joints. Good posture means that your spine is in its natural S-curve position (your spine is neutral), your shoulders are pulled back  slightly, and your head is not tipped forward. The following are general guidelines for applying improved posture and body mechanics to your everyday activities. Standing  When standing, keep your spine neutral and keep your feet about hip-width apart. Keep a slight bend in your knees. Your ears, shoulders, and hips should line up.  When you do a task in which you stand in one place for a long time, place one foot up on a stable object that is 2-4 inches (5-10 cm) high, such as a footstool. This helps keep your spine neutral. Sitting   When sitting, keep your spine neutral and your keep feet flat on the floor. Use a footrest, if necessary, and keep your thighs parallel to the floor. Avoid rounding your shoulders, and avoid tilting your head forward.  When working at a desk or a computer, keep your desk at a height where your hands are slightly lower than your elbows. Slide your chair under your desk so you are close enough to maintain good posture.  When working at a computer, place your monitor at a height where you are looking straight ahead and you do not have to tilt your head forward or downward to look at the screen. Resting When lying down and resting, avoid positions that are most painful for you. Try to support your neck in a neutral position. You can use a contour pillow or a small rolled-up towel. Your pillow should support your neck but not push on it. This information is not intended to replace advice given to you by your health care provider. Make sure you discuss any questions you have with your health care provider. Document Released: 04/03/2005 Document Revised: 12/09/2015 Document Reviewed: 03/10/2015 Elsevier Interactive Patient Education  [employer].

## 2017-08-26 ENCOUNTER — Emergency Department (HOSPITAL_COMMUNITY)
Admission: EM | Admit: 2017-08-26 | Discharge: 2017-08-26 | Disposition: A | Discharge status: Home / Self Care | Payer: Medicare Other | Attending: Emergency Medicine | Admitting: Emergency Medicine

## 2017-08-26 ENCOUNTER — Emergency Department (HOSPITAL_COMMUNITY): Payer: Medicare Other

## 2017-08-26 ENCOUNTER — Encounter (HOSPITAL_COMMUNITY): Payer: Self-pay | Admitting: *Deleted

## 2017-08-26 ENCOUNTER — Other Ambulatory Visit: Payer: Self-pay

## 2017-08-26 DIAGNOSIS — Z86711 Personal history of pulmonary embolism: Secondary | ICD-10-CM | POA: Insufficient documentation

## 2017-08-26 DIAGNOSIS — M542 Cervicalgia: Secondary | ICD-10-CM | POA: Diagnosis not present

## 2017-08-26 DIAGNOSIS — Z8546 Personal history of malignant neoplasm of prostate: Secondary | ICD-10-CM | POA: Insufficient documentation

## 2017-08-26 DIAGNOSIS — Z79899 Other long term (current) drug therapy: Secondary | ICD-10-CM | POA: Diagnosis not present

## 2017-08-26 DIAGNOSIS — W1830XA Fall on same level, unspecified, initial encounter: Secondary | ICD-10-CM | POA: Insufficient documentation

## 2017-08-26 DIAGNOSIS — R42 Dizziness and giddiness: Secondary | ICD-10-CM | POA: Insufficient documentation

## 2017-08-26 DIAGNOSIS — R51 Headache: Secondary | ICD-10-CM | POA: Insufficient documentation

## 2017-08-26 DIAGNOSIS — G8921 Chronic pain due to trauma: Secondary | ICD-10-CM | POA: Insufficient documentation

## 2017-08-26 DIAGNOSIS — Z7982 Long term (current) use of aspirin: Secondary | ICD-10-CM | POA: Insufficient documentation

## 2017-08-26 DIAGNOSIS — F17290 Nicotine dependence, other tobacco product, uncomplicated: Secondary | ICD-10-CM | POA: Insufficient documentation

## 2017-08-26 DIAGNOSIS — R531 Weakness: Secondary | ICD-10-CM | POA: Insufficient documentation

## 2017-08-26 LAB — BASIC METABOLIC PANEL
Anion gap: 10 (ref 5–15)
BUN: 20 mg/dL (ref 6–20)
CO2: 24 mmol/L (ref 22–32)
Calcium: 9.2 mg/dL (ref 8.9–10.3)
Chloride: 107 mmol/L (ref 101–111)
Creatinine, Ser: 0.87 mg/dL (ref 0.61–1.24)
GFR calc Af Amer: >60 mL/min (ref >60)
GFR calc non Af Amer: >60 mL/min (ref >60)
GLUCOSE: 101 mg/dL — AB (ref 65–99)
POTASSIUM: 3.9 mmol/L (ref 3.5–5.1)
Sodium: 141 mmol/L (ref 135–145)

## 2017-08-26 LAB — COMPREHENSIVE METABOLIC PANEL
ALBUMIN: 3.9 g/dL (ref 3.5–5.0)
ALT: 16 U/L — AB (ref 17–63)
AST: 15 U/L (ref 15–41)
Alkaline Phosphatase: 51 U/L (ref 38–126)
Anion gap: 10 (ref 5–15)
BUN: 20 mg/dL (ref 6–20)
CALCIUM: 9.2 mg/dL (ref 8.9–10.3)
CO2: 23 mmol/L (ref 22–32)
Chloride: 108 mmol/L (ref 101–111)
Creatinine, Ser: 0.85 mg/dL (ref 0.61–1.24)
GFR calc non Af Amer: >60 mL/min (ref >60)
GLUCOSE: 100 mg/dL — AB (ref 65–99)
Potassium: 3.9 mmol/L (ref 3.5–5.1)
SODIUM: 141 mmol/L (ref 135–145)
Total Bilirubin: 0.9 mg/dL (ref 0.3–1.2)
Total Protein: 6.6 g/dL (ref 6.5–8.1)

## 2017-08-26 LAB — CBC
HCT: 42.9 % (ref 39.0–52.0)
Hemoglobin: 14 g/dL (ref 13.0–17.0)
MCH: 31 pg (ref 26.0–34.0)
MCHC: 32.6 g/dL (ref 30.0–36.0)
MCV: 95.1 fL (ref 78.0–100.0)
Platelets: 141 10*3/uL — ABNORMAL LOW (ref 150–400)
RBC: 4.51 MIL/uL (ref 4.22–5.81)
RDW: 13.1 % (ref 11.5–15.5)
WBC: 4.5 10*3/uL (ref 4.0–10.5)

## 2017-08-26 LAB — POC OCCULT BLOOD, ED: Fecal Occult Bld: NEGATIVE

## 2017-08-26 LAB — I-STAT TROPONIN, ED: Troponin i, poc: 0.01 ng/mL (ref 0.00–0.08)

## 2017-08-26 LAB — CBG MONITORING, ED: Glucose-Capillary: 86 mg/dL (ref 65–99)

## 2017-08-26 NOTE — ED Triage Notes (Addendum)
Pt states he fell April 7, has been seen and treated for it. Now reports for weeks he is having weakness and dizziness upon waking but improves during the day. He states, "I feel like I am having a stroke or heart attack" when ask to define just states he feels weak and dizzy.  Pt ambulatory and has no noted deficits in triage.

## 2017-08-26 NOTE — Discharge Instructions (Signed)
Lab work and CT scans showed no evidence of stroke or heart attack.  CT scan of your neck shows arthritis.  Your blood pressure was mildly elevated today at 147/82.  it should be rechecked at Dr. Arvilla Market office within the next 3 weeks.  Return if concern for any reason

## 2017-08-26 NOTE — ED Provider Notes (Signed)
[facility] DEPT Provider Note   CSN: [id number] Arrival date & time: 08/26/17  0809     History   Chief Complaint Chief Complaint  Patient presents with  . Weakness  . Dizziness    HPI [name]. is a 84 y.o. male.  HPI Patient states he tripped and fell on April 7 striking his head left shoulder and left neck since the event he feels generalized weakness each day when he wakes up.  Weakness improved throughout the day.  Presently he denies any weakness.  He denies any chest pain.  He does admit to mild left-sided parietal headache and left neck pain since the fall on April 7, improving with time.  No focal numbness or weakness no visual changes.  He is been treating himself with Aleve with partial relief.  No other associated symptoms.  He does report a bruise on his left chest as result of fall which is healing with time. Past Medical History:  Diagnosis Date  . Anemia   . Arthritis    right hand  . Bilateral renal cysts   . Bladder stone   . BPH with elevated PSA   . Cholelithiasis    per CT 09-21-2016  . History of fracture of rib    per pt fractured ribs, multiple, from teen, collage years , in [employer]   . History of iron deficiency anemia   . History of kidney stones   . History of pulmonary embolus (PE)    November 2000 --  completed 5 yrs anticoagulation, now ASA 81mg  since-- no recurrence  . History of skin cancer    per pt removal several times from scalp, unsure BCC or SCC  . Left ureteral stone   . Lower urinary tract symptoms (LUTS)   . Nephrolithiasis    bilateral non-obstructive per CT 09-21-2016  . Prostate carcinoma [facility]) urologist-  dr Alyson Ingles (previously Dr Janice Norrie)   dx Mar 2011-- Gleason 3+3 , PSA 7.65 --- active survillence --- last PSA Aug 2016,  6.95  . Right ureteral stone   . Wears glasses   . Wears hearing aid in both ears     Patient Active Problem List   Diagnosis Date Noted  . Weight loss  10/31/2016  . Coronary artery calcification 10/31/2016  . Bilateral renal cysts 10/31/2016  . Cholelithiasis 10/31/2016  . Aortic atherosclerosis ([place]) 10/31/2016  . History of pulmonary embolus (PE) 07/11/2011  . Prostate carcinoma ([place]) 07/11/2011  . Recurrent nephrolithiasis 07/11/2011  . Iron deficiency anemia 07/11/2011    Past Surgical History:  Procedure Laterality Date  . APPENDECTOMY    . CYSTO/ LEFT RETROGRADE PYELOGRAM/ LEFT STENT PLACEMENT  09-27-2009  . CYSTOSCOPY WITH LITHOLAPAXY N/A 09/28/2016   Procedure: CYSTOSCOPY WITH LITHOLAPAXY;  Surgeon: Cleon Gustin, MD;  Location: [facility];  Service: Urology;  Laterality: N/A;  . CYSTOSCOPY WITH RETROGRADE PYELOGRAM, URETEROSCOPY AND STENT PLACEMENT Left 09/28/2016   Procedure: CYSTOSCOPY WITH RETROGRADE PYELOGRAM, URETEROSCOPY AND STENT PLACEMENT;  Surgeon: Cleon Gustin, MD;  Location: [facility];  Service: Urology;  Laterality: Left;  . CYSTOSCOPY/RETROGRADE/URETEROSCOPY/STONE EXTRACTION WITH BASKET  per pt few times prior to 2011  . EXTRACORPOREAL SHOCK WAVE LITHOTRIPSY Left 09/30/2009  . EXTRACORPOREAL SHOCK WAVE LITHOTRIPSY  per pt multiple times last 50 yrs  . HOLMIUM LASER APPLICATION Left 5/42/7062   Procedure: HOLMIUM LASER APPLICATION;  Surgeon: Cleon Gustin, MD;  Location: [facility];  Service: Urology;  Laterality: Left;  . LEFT URETEROSCOPY STONE EXTRACTION AND REMOVAL BLADDER STONE  05-10-2010  . PERCUTANEOUS NEPHROSTOLITHOTOMY  1965        Home Medications    Prior to Admission medications   Medication Sig Start Date End Date Taking? Authorizing Provider  aspirin 81 MG tablet Take 81 mg by mouth daily.    [provider]  atorvastatin (LIPITOR) 10 MG tablet Take 1 tablet (10 mg total) by mouth daily. 10/31/16   Marletta Lor, MD  Multiple Vitamins-Minerals (CENTRUM SILVER 50+MEN) TABS Take by mouth daily.    [provider]  Multiple Vitamins-Minerals (PRESERVISION/LUTEIN PO) Take 1 tablet by mouth daily.    [provider]  tamsulosin (FLOMAX) 0.4 MG CAPS capsule Take 1 capsule (0.4 mg total) by mouth daily. 09/28/16   McKenzie, Candee Furbish, MD    Family History Family History  Problem Relation Age of Onset  . Arthritis Mother   . Arthritis Father   . Cancer Father     Social History Social History   Tobacco Use  . Smoking status: Current Every Day Smoker    Years: 60.00    Types: Pipe  . Smokeless tobacco: Never Used  Substance Use Topics  . Alcohol use: No  . Drug use: No     Allergies   Patient has no known allergies.   Review of Systems Review of Systems  Musculoskeletal: Positive for neck pain.  Skin:       Ecchymotic area left chest  Neurological: Positive for weakness and headaches.       Generalized weakness  All other systems reviewed and are negative.    Physical Exam Updated Vital Signs BP (!) 147/95 (BP Location: Right Arm)   Pulse 86   Temp 97.8 F (36.6 C) (Oral)   Resp 17   Ht 5\' 9"  (1.753 m)   Wt 73.5 kg (162 lb)   SpO2 96%   BMI 23.92 kg/m   Physical Exam  Constitutional: He is oriented to person, place, and time. He appears well-developed and well-nourished.  HENT:  Head: Normocephalic and atraumatic.  Eyes: Pupils are equal, round, and reactive to light. Conjunctivae are normal.  Neck: Neck supple. No tracheal deviation present. No thyromegaly present.  Cardiovascular: Normal rate, regular rhythm and normal heart sounds.  No murmur heard. Yellowish-green ecchymotic area approximate 5 cm in diameter left chest.  No corresponding tenderness  Pulmonary/Chest: Effort normal and breath sounds normal.  Abdominal: Soft. Bowel sounds are normal. He exhibits no distension. There is no tenderness.  Genitourinary: Rectal exam shows guaiac negative stool.  Genitourinary Comments: Rectum normal tone brown stool no gross blood    Musculoskeletal: Normal range of motion. He exhibits no edema or tenderness.  Neurological: He is alert and oriented to person, place, and time. Coordination normal.  Motor strength 5/5 overall gait normal.  Not lightheaded on standing  Skin: Skin is warm and dry. No rash noted.  Psychiatric: He has a normal mood and affect.  Nursing note and vitals reviewed.    ED Treatments / Results  Labs (all labs ordered are listed, but only abnormal results are displayed) Labs Reviewed  BASIC METABOLIC PANEL  CBC  URINALYSIS, ROUTINE W REFLEX MICROSCOPIC  CBG MONITORING, ED  ED ECG REPORT    Date: 08/26/2017  832 am  Rate: 75  Rhythm: normal sinus rhythm  QRS Axis: normal  Intervals: normal  ST/T Wave abnormalities: normal  Conduction Disutrbances:first-degree A-V block   Narrative Interpretation:  Old EKG Reviewed: none available  I have personally reviewed the EKG tracing and disagree with the computerized printout as noted.  EKG None  Radiology No results found.  Procedures Procedures (including critical care time)  Medications Ordered in ED Medications - No data to display  EKG Interpretation  Date/Time:  Sunday Aug 26 2017 09:09:37 EDT Ventricular Rate:  53 PR Interval:    QRS Duration: 105 QT Interval:  448 QTC Calculation: 421 R Axis:   72 Text Interpretation:  Sinus rhythm No significant change since last tracing Confirmed by Orlie Dakin [id number]) on 08/26/2017 9:24:26 AM      Results for orders placed or performed during the hospital encounter of 53/66/44  Basic metabolic panel  Result Value Ref Range   Sodium 141 135 - 145 mmol/L   Potassium 3.9 3.5 - 5.1 mmol/L   Chloride 107 101 - 111 mmol/L   CO2 24 22 - 32 mmol/L   Glucose, Bld 101 (H) 65 - 99 mg/dL   BUN 20 6 - 20 mg/dL   Creatinine, Ser 0.87 0.61 - 1.24 mg/dL   Calcium 9.2 8.9 - 10.3 mg/dL   GFR calc non Af Amer >60 >60 mL/min   GFR calc Af Amer >60 >60 mL/min   Anion gap 10 5 - 15  CBC   Result Value Ref Range   WBC 4.5 4.0 - 10.5 K/uL   RBC 4.51 4.22 - 5.81 MIL/uL   Hemoglobin 14.0 13.0 - 17.0 g/dL   HCT 42.9 39.0 - 52.0 %   MCV 95.1 78.0 - 100.0 fL   MCH 31.0 26.0 - 34.0 pg   MCHC 32.6 30.0 - 36.0 g/dL   RDW 13.1 11.5 - 15.5 %   Platelets 141 (L) 150 - 400 K/uL  Comprehensive metabolic panel  Result Value Ref Range   Sodium 141 135 - 145 mmol/L   Potassium 3.9 3.5 - 5.1 mmol/L   Chloride 108 101 - 111 mmol/L   CO2 23 22 - 32 mmol/L   Glucose, Bld 100 (H) 65 - 99 mg/dL   BUN 20 6 - 20 mg/dL   Creatinine, Ser 0.85 0.61 - 1.24 mg/dL   Calcium 9.2 8.9 - 10.3 mg/dL   Total Protein 6.6 6.5 - 8.1 g/dL   Albumin 3.9 3.5 - 5.0 g/dL   AST 15 15 - 41 U/L   ALT 16 (L) 17 - 63 U/L   Alkaline Phosphatase 51 38 - 126 U/L   Total Bilirubin 0.9 0.3 - 1.2 mg/dL   GFR calc non Af Amer >60 >60 mL/min   GFR calc Af Amer >60 >60 mL/min   Anion gap 10 5 - 15  CBG monitoring, ED  Result Value Ref Range   Glucose-Capillary 86 65 - 99 mg/dL  I-stat troponin, ED  Result Value Ref Range   Troponin i, poc 0.01 0.00 - 0.08 ng/mL   Comment 3          POC occult blood, ED Provider will collect  Result Value Ref Range   Fecal Occult Bld NEGATIVE NEGATIVE   Ct Head Wo Contrast  Result Date: 08/26/2017 CLINICAL DATA:  Pt states he fell April 7, has been seen and treated for it. Now reports for weeks he is having weakness and dizziness upon waking but improves during the day. He states, "I feel like I am having a stroke or heart attack" EXAM: CT HEAD WITHOUT CONTRAST CT CERVICAL SPINE WITHOUT CONTRAST TECHNIQUE: Multidetector CT imaging of the head  and cervical spine was performed following the standard protocol without intravenous contrast. Multiplanar CT image reconstructions of the cervical spine were also generated. COMPARISON:  None. FINDINGS: CT HEAD FINDINGS Brain: There is moderate central and cortical atrophy. Periventricular white matter changes are consistent with small vessel  disease. There is no intra or extra-axial fluid collection or mass lesion. The basilar cisterns and ventricles have a normal appearance. There is no CT evidence for acute infarction or hemorrhage. Vascular: There is minimal atherosclerotic calcification of the internal carotid arteries. Skull: Normal. Negative for fracture or focal lesion. Sinuses/Orbits: No acute finding. Other: None CT CERVICAL SPINE FINDINGS Alignment: Normal. Skull base and vertebrae: No acute fracture. No primary bone lesion or focal pathologic process. Soft tissues and spinal canal: No prevertebral fluid or swelling. No visible canal hematoma. Disc levels: There are significant mid cervical degenerative changes. Disc height loss and vacuum disc at C5-6. Facet hypertrophy is noted at all levels. There is RIGHT-sided foraminal narrowing at C2-3, C3-4, C4-5. LEFT foraminal narrowing identified at C6-7 the C7-T1. Upper chest: Negative. Other: None IMPRESSION: 1. Atrophy and small vessel disease. 2.  No evidence for acute intracranial abnormality. 3. Significant mid cervical spondylosis without acute fracture or subluxation. Electronically Signed   By: Nolon Nations M.D.   On: 08/26/2017 09:51   Ct Cervical Spine Wo Contrast  Result Date: 08/26/2017 CLINICAL DATA:  Pt states he fell April 7, has been seen and treated for it. Now reports for weeks he is having weakness and dizziness upon waking but improves during the day. He states, "I feel like I am having a stroke or heart attack" EXAM: CT HEAD WITHOUT CONTRAST CT CERVICAL SPINE WITHOUT CONTRAST TECHNIQUE: Multidetector CT imaging of the head and cervical spine was performed following the standard protocol without intravenous contrast. Multiplanar CT image reconstructions of the cervical spine were also generated. COMPARISON:  None. FINDINGS: CT HEAD FINDINGS Brain: There is moderate central and cortical atrophy. Periventricular white matter changes are consistent with small vessel  disease. There is no intra or extra-axial fluid collection or mass lesion. The basilar cisterns and ventricles have a normal appearance. There is no CT evidence for acute infarction or hemorrhage. Vascular: There is minimal atherosclerotic calcification of the internal carotid arteries. Skull: Normal. Negative for fracture or focal lesion. Sinuses/Orbits: No acute finding. Other: None CT CERVICAL SPINE FINDINGS Alignment: Normal. Skull base and vertebrae: No acute fracture. No primary bone lesion or focal pathologic process. Soft tissues and spinal canal: No prevertebral fluid or swelling. No visible canal hematoma. Disc levels: There are significant mid cervical degenerative changes. Disc height loss and vacuum disc at C5-6. Facet hypertrophy is noted at all levels. There is RIGHT-sided foraminal narrowing at C2-3, C3-4, C4-5. LEFT foraminal narrowing identified at C6-7 the C7-T1. Upper chest: Negative. Other: None IMPRESSION: 1. Atrophy and small vessel disease. 2.  No evidence for acute intracranial abnormality. 3. Significant mid cervical spondylosis without acute fracture or subluxation. Electronically Signed   By: Nolon Nations M.D.   On: 08/26/2017 09:51   Initial Impression / Assessment and Plan / ED Course  I have reviewed the triage vital signs and the nursing notes.  Pertinent labs & imaging results that were available during my care of the patient were reviewed by me and considered in my medical decision making (see chart for details).     10:10 AM patient resting comfortably.  Asymptomatic.  Gone over CT scans and lab results with patient.  Plan follow-up with  Dr.Kwaitowski blood pressure recheck 3 weeks.  Final Clinical Impressions(s) / ED Diagnoses  dx #1 generalized weakness #2 elevated blood pressure Final diagnoses:  None    ED Discharge Orders    None       Orlie Dakin, MD 08/26/17 1015

## 2018-07-18 ENCOUNTER — Telehealth: Payer: Self-pay

## 2018-07-18 NOTE — Telephone Encounter (Signed)
Author phoned pt. to offer to schedule TOC appointment as well as AWV. No answer, author left detailed VM asking for return call.

## 2019-01-22 ENCOUNTER — Encounter: Payer: Medicare Other | Admitting: Internal Medicine

## 2019-02-05 ENCOUNTER — Other Ambulatory Visit: Payer: Self-pay

## 2019-02-05 ENCOUNTER — Encounter: Payer: Self-pay | Admitting: Family Medicine

## 2019-02-05 ENCOUNTER — Ambulatory Visit (INDEPENDENT_AMBULATORY_CARE_PROVIDER_SITE_OTHER): Payer: Medicare Other | Admitting: Family Medicine

## 2019-02-05 VITALS — BP 110/80 | HR 63 | Temp 98.2°F | Resp 16 | Ht 69.0 in | Wt 156.4 lb

## 2019-02-05 DIAGNOSIS — I700 Atherosclerosis of aorta: Secondary | ICD-10-CM | POA: Diagnosis not present

## 2019-02-05 DIAGNOSIS — K5900 Constipation, unspecified: Secondary | ICD-10-CM | POA: Diagnosis not present

## 2019-02-05 DIAGNOSIS — C61 Malignant neoplasm of prostate: Secondary | ICD-10-CM

## 2019-02-05 DIAGNOSIS — M159 Polyosteoarthritis, unspecified: Secondary | ICD-10-CM | POA: Insufficient documentation

## 2019-02-05 LAB — BASIC METABOLIC PANEL
BUN: 20 mg/dL (ref 6–23)
CO2: 26 mEq/L (ref 19–32)
Calcium: 9.3 mg/dL (ref 8.4–10.5)
Chloride: 107 mEq/L (ref 96–112)
Creatinine, Ser: 0.78 mg/dL (ref 0.40–1.50)
GFR: 94.1 mL/min (ref >60.00)
Glucose, Bld: 86 mg/dL (ref 70–99)
Potassium: 4.4 mEq/L (ref 3.5–5.1)
Sodium: 140 mEq/L (ref 135–145)

## 2019-02-05 LAB — TSH: TSH: 1.81 u[IU]/mL (ref 0.35–4.50)

## 2019-02-05 LAB — CBC
HCT: 40.8 % (ref 39.0–52.0)
Hemoglobin: 13.6 g/dL (ref 13.0–17.0)
MCHC: 33.4 g/dL (ref 30.0–36.0)
MCV: 93.9 fl (ref 78.0–100.0)
Platelets: 163 10*3/uL (ref 150.0–400.0)
RBC: 4.35 Mil/uL (ref 4.22–5.81)
RDW: 13.4 % (ref 11.5–15.5)
WBC: 4.6 10*3/uL (ref 4.0–10.5)
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
## 2019-02-05 NOTE — Patient Instructions (Addendum)
Health Maintenance Due  Topic Date Due  . TETANUS/TDAP  11/06/1950  . PNA vac Low Risk Adult (1 of 2 - PCV13) 11/05/1996    Depression screen PHQ 2/9 10/17/2016  Decreased Interest 0  Down, Depressed, Hopeless 0  PHQ - 2 Score 0  A few things to remember from today's visit:   Constipation, unspecified constipation type - Plan: TSH, Basic metabolic panel, CBC  Aortic atherosclerosis ([place]), Chronic  Prostate carcinoma ([place]), Chronic - Plan: CBC  I recommend taking MiraLAX daily at night as needed. Increase fiber intake, adequate hydration. You can get over-the-counter Benefiber, which is fiber, and take 1 teaspoon twice daily with water.  If still having problems with having bowel movements, we could add bisacodyl 5 mg daily as needed. We wait to see if MiraLAX alone and Benefiber helped.  The test we did here to check for blood seems to be weak positive, so you may have gastrointestinal bleeding. Let me know if you are interested in proceeding with abdominal ultrasound or other type of imaging and/or if you are interested in seeing a gastroenterologist.  Aspirin increases the risk of bleeding.

## 2019-07-31 ENCOUNTER — Other Ambulatory Visit: Payer: Self-pay | Admitting: Family Medicine

## 2019-07-31 ENCOUNTER — Other Ambulatory Visit: Payer: Self-pay

## 2019-07-31 ENCOUNTER — Ambulatory Visit
Admission: RE | Admit: 2019-07-31 | Discharge: 2019-07-31 | Disposition: A | Discharge status: Home / Self Care | Payer: Medicare Other | Source: Ambulatory Visit | Attending: Family Medicine | Admitting: Family Medicine

## 2019-07-31 DIAGNOSIS — M5442 Lumbago with sciatica, left side: Secondary | ICD-10-CM

## 2019-07-31 DIAGNOSIS — M4003 Postural kyphosis, cervicothoracic region: Secondary | ICD-10-CM

## 2019-09-29 ENCOUNTER — Ambulatory Visit: Payer: Medicare Other | Admitting: Physical Therapy

## 2019-10-01 ENCOUNTER — Encounter: Payer: Self-pay | Admitting: Physical Therapy

## 2019-10-01 ENCOUNTER — Ambulatory Visit (INDEPENDENT_AMBULATORY_CARE_PROVIDER_SITE_OTHER): Payer: Medicare Other | Admitting: Physical Therapy

## 2019-10-01 ENCOUNTER — Other Ambulatory Visit: Payer: Self-pay

## 2019-10-01 DIAGNOSIS — R2689 Other abnormalities of gait and mobility: Secondary | ICD-10-CM

## 2019-10-01 DIAGNOSIS — M79605 Pain in left leg: Secondary | ICD-10-CM | POA: Diagnosis not present

## 2019-10-01 DIAGNOSIS — M79604 Pain in right leg: Secondary | ICD-10-CM | POA: Diagnosis not present

## 2019-10-01 DIAGNOSIS — M5416 Radiculopathy, lumbar region: Secondary | ICD-10-CM | POA: Diagnosis not present

## 2019-10-01 NOTE — Therapy (Signed)
[facility] [address] [place], [place], [address] Phone: [phone]   Fax:  [phone]  Physical Therapy Evaluation  Patient Details  Name: [name]. MRN: [id number] Date of Birth: [birth year] Referring Provider (PT): Cythia White   Encounter Date: 10/01/2019   PT End of Session - 10/01/19 1625    Visit Number 1    Number of Visits 12    Date for PT Re-Evaluation 11/12/19    Authorization Type U[employer]    PT Start Time 1540    PT Stop Time 1620    PT Time Calculation (min) 40 min    Activity Tolerance Patient tolerated treatment well    Behavior During Therapy WFL for tasks assessed/performed           Past Medical History:  Diagnosis Date  . Anemia   . Arthritis    right hand  . Bilateral renal cysts   . Bladder stone   . BPH with elevated PSA   . Cholelithiasis    per CT 09-21-2016  . History of fracture of rib    per pt fractured ribs, multiple, from teen, collage years , in [employer]   . History of iron deficiency anemia   . History of kidney stones   . History of pulmonary embolus (PE)    November 2000 --  completed 5 yrs anticoagulation, now ASA 81mg  since-- no recurrence  . History of skin cancer    per pt removal several times from scalp, unsure BCC or SCC  . Left ureteral stone   . Lower urinary tract symptoms (LUTS)   . Nephrolithiasis    bilateral non-obstructive per CT 09-21-2016  . Prostate carcinoma [facility]) urologist-  dr Alyson Ingles (previously Dr Janice Norrie)   dx Mar 2011-- Gleason 3+3 , PSA 7.65 --- active survillence --- last PSA Aug 2016,  6.95  . Right ureteral stone   . Wears glasses   . Wears hearing aid in both ears     Past Surgical History:  Procedure Laterality Date  . APPENDECTOMY    . CYSTO/ LEFT RETROGRADE PYELOGRAM/ LEFT STENT PLACEMENT  09-27-2009  . CYSTOSCOPY WITH LITHOLAPAXY N/A 09/28/2016   Procedure: CYSTOSCOPY WITH LITHOLAPAXY;  Surgeon: Cleon Gustin, MD;   Location[facility];  Service: Urology;  Laterality: N/A;  . CYSTOSCOPY WITH RETROGRADE PYELOGRAM, URETEROSCOPY AND STENT PLACEMENT Left 09/28/2016   Procedure: CYSTOSCOPY WITH RETROGRADE PYELOGRAM, URETEROSCOPY AND STENT PLACEMENT;  Surgeon: Cleon Gustin, MD;  Location: [facility];  Service: Urology;  Laterality: Left;  . CYSTOSCOPY/RETROGRADE/URETEROSCOPY/STONE EXTRACTION WITH BASKET  per pt few times prior to 2011  . EXTRACORPOREAL SHOCK WAVE LITHOTRIPSY Left 09/30/2009  . EXTRACORPOREAL SHOCK WAVE LITHOTRIPSY  per pt multiple times last 50 yrs  . HOLMIUM LASER APPLICATION Left 1/32/4401   Procedure: HOLMIUM LASER APPLICATION;  Surgeon: Cleon Gustin, MD;  Location: [facility];  Service: Urology;  Laterality: Left;  . LEFT URETEROSCOPY STONE EXTRACTION AND REMOVAL BLADDER STONE  05-10-2010  . PERCUTANEOUS NEPHROSTOLITHOTOMY  1965    There were no vitals filed for this visit.    Subjective Assessment - 10/01/19 1544    Subjective Week of 07/24/22, wife passed away, states following week his legs started "giving way". Uses RW and cane. Most pain from hips to knees. No back pain. Most pain in AM, stiffness, and gets better during the day.    Currently in Pain? Yes    Pain Score  8     Pain Location Leg    Pain Orientation Left;Right    Pain Descriptors / Indicators Aching    Pain Type Chronic pain;Acute pain    Pain Onset More than a month ago    Pain Frequency Intermittent              [facility] PT Assessment - 10/01/19 0001      Assessment   Medical Diagnosis Lumbago with sciatica    Referring Provider (PT) Cythia White    Prior Therapy no      Balance Screen   Has the patient fallen in the past 6 months No      Prior Function   Level of [place] Independent      Cognition   Overall Cognitive Status Within Functional Limits for tasks assessed      Posture/Postural Control   Posture Comments severe forward  head, sitting in significant posterior pelvic tilt, thoracic and lumbar kyphosis, lumbar/pelvic posture moderately flexible.       ROM / Strength   AROM / PROM / Strength AROM;Strength      AROM   Overall AROM Comments LE: WFL, L knee extension mild/mod limitation       Strength   Overall Strength Comments HIps: 4-/5, KNee: 4/5       Palpation   Palpation comment Sig tightness in bil hamstrings.                       Objective measurements completed on examination: See above findings.       [facility] Adult PT Treatment/Exercise - 10/01/19 0001      Ambulation/Gait   Gait Comments RW: poor foot clearance, mild shuffle,, no push off , very poor posture       Exercises   Exercises Lumbar      Lumbar Exercises: Stretches   Active Hamstring Stretch 2 reps;30 seconds    Active Hamstring Stretch Limitations seated    Single Knee to Chest Stretch 2 reps;30 seconds    Pelvic Tilt 15 reps      Lumbar Exercises: Seated   Other Seated Lumbar Exercises Scap retract with practice for upright posture x 10;                   PT Education - 10/01/19 1625    Education Details PT POC, Exam findings, initial HEP, Posture    Person(s) Educated Patient    Methods Explanation;Demonstration;Verbal cues;Handout;Tactile cues    Comprehension Verbalized understanding;Returned demonstration;Verbal cues required;Tactile cues required;Need further instruction            PT Short Term Goals - 10/01/19 1627      PT SHORT TERM GOAL #1   Title Pt to be independent with initial HEP    Time 2    Period Weeks    Status New    Target Date 10/15/19             PT Long Term Goals - 10/01/19 1627      PT LONG TERM GOAL #1   Title Pt to be independent wtih final HEP    Time 6    Period Weeks    Status New    Target Date 11/12/19      PT LONG TERM GOAL #2   Title Pt to report decreased pain in bil LEs to 0-3/10 in am and with standing activity    Time 6    Period  Weeks  Status New    Target Date 11/12/19      PT LONG TERM GOAL #3   Title Pt to demo improved strength of bil LEs to at least 4+/5 to improve stability and pain.    Time 6    Period Weeks    Status New    Target Date 11/12/19      PT LONG TERM GOAL #4   Title Pt to demo ability for optimal seated posture, for at least 5 min in clinic    Time 6    Period Weeks    Status New    Target Date 11/12/19                  Plan - 10/01/19 1629    Clinical Impression Statement Pt presents with primary complaint of bil LE pain in thighs. Pts pain likely stemming from back and lumbar degeneration. Pt wtih good ROM of LEs, but decreased strenth for hips and knees. Pt with significant posture deficits, with severe forward head, thoracic  and lumbar kyphosis in sitting and standing. Pt able to improve somewhat with cueing, but unable to hold for more than 1 min. Pt with lack of effective HEP for Dx. Pt also with decreased gait mechanics,using RW, with poor foot clearance bilaterally, and wil benefit from gait training and education on use of RW as well. Pt with decreased ability for full functional activiteis, due to pain, and will benefit form skilled PT to improve.    Personal Factors and Comorbidities Age;Comorbidity 1    Comorbidities Posture, spinal degeneration,    Examination-Activity Limitations Sleep;Stand;Lift;Locomotion Level;Stairs;Squat;Bend    Examination-Participation Restrictions Meal Prep;Cleaning;Community Activity;Driving    Stability/Clinical Decision Making Evolving/Moderate complexity    Clinical Decision Making Moderate    Rehab Potential Good    PT Frequency 2x / week    PT Duration 6 weeks    PT Treatment/Interventions ADLs/Self Care Home Management;Electrical Stimulation;Cryotherapy;Iontophoresis 4mg /ml Dexamethasone;Moist Heat;DME Instruction;Gait training;Traction;Stair training;Functional mobility training;Therapeutic activities;Therapeutic exercise;Balance  training;Neuromuscular re-education;Patient/family education;Manual techniques;Dry needling;Passive range of motion;Vasopneumatic Device;Spinal Manipulations;Joint Manipulations    PT Home Exercise Plan [email or web address]    Recommended Other Services Pt requests to come 1x/wk ,due to relying on transportation from friend    Consulted and Agree with Plan of Care Patient           Patient will benefit from skilled therapeutic intervention in order to improve the following deficits and impairments:  Abnormal gait, Decreased range of motion, Difficulty walking, Decreased safety awareness, Decreased activity tolerance, Pain, Impaired perceived functional ability, Impaired flexibility, Improper body mechanics, Postural dysfunction, Hypomobility, Decreased knowledge of use of DME, Decreased balance, Decreased mobility, Decreased strength  Visit Diagnosis: Pain in left leg - Plan: PT plan of care cert/re-cert  Pain in right leg - Plan: PT plan of care cert/re-cert  Radiculopathy, lumbar region - Plan: PT plan of care cert/re-cert  Other abnormalities of gait and mobility - Plan: PT plan of care cert/re-cert     Problem List Patient Active Problem List   Diagnosis Date Noted  . Generalized osteoarthritis of multiple sites 02/05/2019  . Weight loss 10/31/2016  . Coronary artery calcification 10/31/2016  . Bilateral renal cysts 10/31/2016  . Cholelithiasis 10/31/2016  . Aortic atherosclerosis ([place]) 10/31/2016  . History of pulmonary embolus (PE) 07/11/2011  . Prostate carcinoma ([place]) 07/11/2011  . Recurrent nephrolithiasis 07/11/2011  . Iron deficiency anemia 07/11/2011    Lyndee Hensen, PT, DPT 4:40 PM  10/01/19    [facility] [address] [place], [place], [address] Phone: [phone]   Fax:  [phone]  Name: [name]. MRN: [id number] Date of Birth: 03-22-1930

## 2019-10-01 NOTE — Patient Instructions (Signed)
Access Code: [email or web address] URL: [email or web address] Date: 10/01/2019 Prepared by: Lyndee Hensen  Exercises Supine Posterior Pelvic Tilt - 2 x daily - 2 sets - 10 reps Single Knee to Chest Stretch - 2 x daily - 3 reps - 30 hold Seated Hamstring Stretch - 2 x daily - 3 reps - 30 hold Seated Scapular Retraction - 2 x daily - 1 sets - 10 reps

## 2019-10-22 ENCOUNTER — Other Ambulatory Visit: Payer: Self-pay | Admitting: Physical Medicine & Rehabilitation

## 2019-10-22 DIAGNOSIS — M5442 Lumbago with sciatica, left side: Secondary | ICD-10-CM

## 2019-11-02 ENCOUNTER — Ambulatory Visit
Admission: RE | Admit: 2019-11-02 | Discharge: 2019-11-02 | Disposition: A | Discharge status: Home / Self Care | Payer: Medicare Other | Source: Ambulatory Visit | Attending: Physical Medicine & Rehabilitation | Admitting: Physical Medicine & Rehabilitation

## 2019-11-02 ENCOUNTER — Other Ambulatory Visit: Payer: Self-pay

## 2019-11-02 DIAGNOSIS — M5442 Lumbago with sciatica, left side: Secondary | ICD-10-CM

## 2020-02-09 ENCOUNTER — Other Ambulatory Visit: Payer: Medicare Other

## 2020-05-20 ENCOUNTER — Telehealth: Payer: Self-pay | Admitting: Family Medicine

## 2020-05-20 NOTE — Telephone Encounter (Signed)
Left message for patient to call back and schedule Medicare Annual Wellness Visit (AWV) either virtually or in office. Left no details   Last AWV no information please schedule at anytime with [facility] Nurse Health Advisor 1 or 2   This should be a 45 minute visit. Patient also needs appointment with pcp last appointment 02/05/2019

## 2020-07-12 ENCOUNTER — Telehealth: Payer: Self-pay | Admitting: Family Medicine

## 2020-07-12 NOTE — Telephone Encounter (Signed)
Left message for patient to call back and schedule Medicare Annual Wellness Visit (AWV) either virtually or in office No detailed message left .   awvi 04/17/09 per [facility]   please schedule at anytime with [facility] Nurse Health Advisor 1 or 2   This should be a 45 minute visit. Patient also needs appointment with PCP last appointment 02/05/2019

## 2020-10-07 ENCOUNTER — Telehealth: Payer: Self-pay | Admitting: Family Medicine

## 2020-10-07 NOTE — Telephone Encounter (Signed)
Spoke with patient to schedule Medicare Annual Wellness Visit (AWV) either virtually or in office.  He stated he would call back to schedule   awvi 04/17/09 per [facility]  please schedule at anytime with [facility] Nurse Health Advisor 1 or 2  Patient also needs appointment with pcp last appointment 02/05/2019   This should be a 45 minute visit.

## 2020-11-11 ENCOUNTER — Telehealth: Payer: Self-pay | Admitting: Family Medicine

## 2020-11-11 NOTE — Telephone Encounter (Signed)
Left message for patient to call back and schedule Medicare Annual Wellness Visit (AWV) either virtually or in office.  awvi 04/17/09 per [facility]  please schedule at anytime with [facility] Nurse Health Advisor 1 or 2  Patient also needs appointment with pcp last appointment 01/2019  This should be a 45 minute visit.

## 2020-12-08 ENCOUNTER — Telehealth: Payer: Self-pay | Admitting: Family Medicine

## 2020-12-08 NOTE — Telephone Encounter (Signed)
Left message for patient to call back and schedule Medicare Annual Wellness Visit (AWV) either virtually or in office.   Left  my Herbie Drape number [phone]   Last AWV  awvi 04/17/09 per [facility] please schedule at anytime with [facility] Nurse Health Advisor 1 or 2   This should be a 45 minute visit.

## 2021-10-17 ENCOUNTER — Other Ambulatory Visit: Payer: Self-pay | Admitting: Family Medicine

## 2021-10-17 ENCOUNTER — Ambulatory Visit
Admission: RE | Admit: 2021-10-17 | Discharge: 2021-10-17 | Disposition: A | Discharge status: Home / Self Care | Payer: Medicare Other | Source: Ambulatory Visit | Attending: Family Medicine | Admitting: Family Medicine

## 2021-10-17 ENCOUNTER — Other Ambulatory Visit: Payer: Medicare Other

## 2021-10-17 DIAGNOSIS — R635 Abnormal weight gain: Secondary | ICD-10-CM

## 2021-10-17 DIAGNOSIS — M25561 Pain in right knee: Secondary | ICD-10-CM

## 2021-10-17 DIAGNOSIS — M25551 Pain in right hip: Secondary | ICD-10-CM

## 2022-01-11 ENCOUNTER — Other Ambulatory Visit: Payer: Self-pay | Admitting: Family Medicine

## 2022-01-11 DIAGNOSIS — R109 Unspecified abdominal pain: Secondary | ICD-10-CM

## 2022-01-11 DIAGNOSIS — R634 Abnormal weight loss: Secondary | ICD-10-CM

## 2022-01-27 ENCOUNTER — Ambulatory Visit
Admission: RE | Admit: 2022-01-27 | Discharge: 2022-01-27 | Disposition: A | Discharge status: Home / Self Care | Payer: Medicare Other | Source: Ambulatory Visit | Attending: Family Medicine | Admitting: Family Medicine

## 2022-01-27 DIAGNOSIS — R634 Abnormal weight loss: Secondary | ICD-10-CM

## 2022-01-27 DIAGNOSIS — R109 Unspecified abdominal pain: Secondary | ICD-10-CM

## 2022-01-27 MED ORDER — IOPAMIDOL (ISOVUE-300) INJECTION 61%
100.0000 mL | Freq: Once | INTRAVENOUS | Status: AC | PRN
  Administered 2022-01-27: 100 mL via INTRAVENOUS

## 2022-12-21 ENCOUNTER — Other Ambulatory Visit: Payer: Self-pay | Admitting: Family Medicine

## 2022-12-21 ENCOUNTER — Ambulatory Visit
Admission: RE | Admit: 2022-12-21 | Discharge: 2022-12-21 | Disposition: A | Discharge status: Home / Self Care | Payer: Medicare Other | Source: Ambulatory Visit | Attending: Family Medicine | Admitting: Family Medicine

## 2022-12-21 DIAGNOSIS — R634 Abnormal weight loss: Secondary | ICD-10-CM

## 2022-12-21 MED ORDER — IOPAMIDOL (ISOVUE-300) INJECTION 61%
500.0000 mL | Freq: Once | INTRAVENOUS | Status: AC | PRN
  Administered 2022-12-21: 90 mL via INTRAVENOUS

## 2023-01-16 DEATH — deceased
